# Patient Record
Sex: Female | Born: 1940
Health system: Southern US, Community
[De-identification: ages and names within clinical notes are randomized; demographics above are authoritative.]

## PROBLEM LIST (undated history)

## (undated) DIAGNOSIS — F329 Major depressive disorder, single episode, unspecified: Secondary | ICD-10-CM

## (undated) DIAGNOSIS — M940 Chondrocostal junction syndrome [Tietze]: Secondary | ICD-10-CM

## (undated) DIAGNOSIS — I2510 Atherosclerotic heart disease of native coronary artery without angina pectoris: Secondary | ICD-10-CM

## (undated) DIAGNOSIS — R519 Headache, unspecified: Secondary | ICD-10-CM

## (undated) DIAGNOSIS — K209 Esophagitis, unspecified without bleeding: Secondary | ICD-10-CM

## (undated) DIAGNOSIS — R51 Headache: Secondary | ICD-10-CM

## (undated) DIAGNOSIS — F410 Panic disorder [episodic paroxysmal anxiety] without agoraphobia: Secondary | ICD-10-CM

## (undated) DIAGNOSIS — I10 Essential (primary) hypertension: Secondary | ICD-10-CM

## (undated) DIAGNOSIS — F419 Anxiety disorder, unspecified: Secondary | ICD-10-CM

## (undated) DIAGNOSIS — E785 Hyperlipidemia, unspecified: Secondary | ICD-10-CM

## (undated) DIAGNOSIS — E119 Type 2 diabetes mellitus without complications: Secondary | ICD-10-CM

## (undated) DIAGNOSIS — K649 Unspecified hemorrhoids: Secondary | ICD-10-CM

## (undated) DIAGNOSIS — K219 Gastro-esophageal reflux disease without esophagitis: Secondary | ICD-10-CM

## (undated) DIAGNOSIS — F32A Depression, unspecified: Secondary | ICD-10-CM

## (undated) DIAGNOSIS — T7840XA Allergy, unspecified, initial encounter: Secondary | ICD-10-CM

## (undated) DIAGNOSIS — I509 Heart failure, unspecified: Secondary | ICD-10-CM

## (undated) HISTORY — DX: Heart failure, unspecified: I50.9

## (undated) HISTORY — PX: APPENDECTOMY: SHX54

## (undated) HISTORY — DX: Atherosclerotic heart disease of native coronary artery without angina pectoris: I25.10

## (undated) HISTORY — DX: Anxiety disorder, unspecified: F41.9

## (undated) HISTORY — PX: KNEE SURGERY: SHX244

## (undated) HISTORY — PX: DILATION AND CURETTAGE, DIAGNOSTIC / THERAPEUTIC: SUR384

---

## 2009-06-28 ENCOUNTER — Ambulatory Visit: Payer: Self-pay | Admitting: Unknown Physician Specialty

## 2015-09-23 DIAGNOSIS — E119 Type 2 diabetes mellitus without complications: Secondary | ICD-10-CM | POA: Diagnosis not present

## 2015-09-23 DIAGNOSIS — E785 Hyperlipidemia, unspecified: Secondary | ICD-10-CM | POA: Diagnosis not present

## 2015-09-23 DIAGNOSIS — K219 Gastro-esophageal reflux disease without esophagitis: Secondary | ICD-10-CM | POA: Diagnosis not present

## 2015-10-21 DIAGNOSIS — F4002 Agoraphobia without panic disorder: Secondary | ICD-10-CM | POA: Diagnosis not present

## 2015-11-16 DIAGNOSIS — H8112 Benign paroxysmal vertigo, left ear: Secondary | ICD-10-CM | POA: Diagnosis not present

## 2015-11-23 DIAGNOSIS — H8112 Benign paroxysmal vertigo, left ear: Secondary | ICD-10-CM | POA: Diagnosis not present

## 2015-11-30 DIAGNOSIS — H8111 Benign paroxysmal vertigo, right ear: Secondary | ICD-10-CM | POA: Diagnosis not present

## 2015-12-07 DIAGNOSIS — R42 Dizziness and giddiness: Secondary | ICD-10-CM | POA: Diagnosis not present

## 2015-12-29 DIAGNOSIS — Z85828 Personal history of other malignant neoplasm of skin: Secondary | ICD-10-CM | POA: Diagnosis not present

## 2015-12-29 DIAGNOSIS — D485 Neoplasm of uncertain behavior of skin: Secondary | ICD-10-CM | POA: Diagnosis not present

## 2015-12-29 DIAGNOSIS — L718 Other rosacea: Secondary | ICD-10-CM | POA: Diagnosis not present

## 2015-12-29 DIAGNOSIS — D0471 Carcinoma in situ of skin of right lower limb, including hip: Secondary | ICD-10-CM | POA: Diagnosis not present

## 2015-12-29 DIAGNOSIS — Z08 Encounter for follow-up examination after completed treatment for malignant neoplasm: Secondary | ICD-10-CM | POA: Diagnosis not present

## 2015-12-29 DIAGNOSIS — L565 Disseminated superficial actinic porokeratosis (DSAP): Secondary | ICD-10-CM | POA: Diagnosis not present

## 2016-02-08 DIAGNOSIS — F329 Major depressive disorder, single episode, unspecified: Secondary | ICD-10-CM | POA: Diagnosis not present

## 2016-02-08 DIAGNOSIS — E119 Type 2 diabetes mellitus without complications: Secondary | ICD-10-CM | POA: Diagnosis not present

## 2016-02-08 DIAGNOSIS — I10 Essential (primary) hypertension: Secondary | ICD-10-CM | POA: Diagnosis not present

## 2016-02-08 DIAGNOSIS — E785 Hyperlipidemia, unspecified: Secondary | ICD-10-CM | POA: Diagnosis not present

## 2016-02-08 DIAGNOSIS — F410 Panic disorder [episodic paroxysmal anxiety] without agoraphobia: Secondary | ICD-10-CM | POA: Diagnosis not present

## 2016-04-05 DIAGNOSIS — L578 Other skin changes due to chronic exposure to nonionizing radiation: Secondary | ICD-10-CM | POA: Diagnosis not present

## 2016-04-05 DIAGNOSIS — L908 Other atrophic disorders of skin: Secondary | ICD-10-CM | POA: Diagnosis not present

## 2016-04-05 DIAGNOSIS — D0471 Carcinoma in situ of skin of right lower limb, including hip: Secondary | ICD-10-CM | POA: Diagnosis not present

## 2016-04-05 DIAGNOSIS — D492 Neoplasm of unspecified behavior of bone, soft tissue, and skin: Secondary | ICD-10-CM | POA: Diagnosis not present

## 2016-04-05 DIAGNOSIS — L820 Inflamed seborrheic keratosis: Secondary | ICD-10-CM | POA: Diagnosis not present

## 2016-04-27 DIAGNOSIS — F4002 Agoraphobia without panic disorder: Secondary | ICD-10-CM | POA: Diagnosis not present

## 2016-06-07 DIAGNOSIS — L578 Other skin changes due to chronic exposure to nonionizing radiation: Secondary | ICD-10-CM | POA: Diagnosis not present

## 2016-06-07 DIAGNOSIS — Z85828 Personal history of other malignant neoplasm of skin: Secondary | ICD-10-CM | POA: Diagnosis not present

## 2016-08-02 DIAGNOSIS — I10 Essential (primary) hypertension: Secondary | ICD-10-CM | POA: Diagnosis not present

## 2016-08-02 DIAGNOSIS — Z1211 Encounter for screening for malignant neoplasm of colon: Secondary | ICD-10-CM | POA: Diagnosis not present

## 2016-08-02 DIAGNOSIS — E119 Type 2 diabetes mellitus without complications: Secondary | ICD-10-CM | POA: Diagnosis not present

## 2016-10-04 DIAGNOSIS — E119 Type 2 diabetes mellitus without complications: Secondary | ICD-10-CM | POA: Diagnosis not present

## 2016-10-04 DIAGNOSIS — K219 Gastro-esophageal reflux disease without esophagitis: Secondary | ICD-10-CM | POA: Diagnosis not present

## 2016-10-04 DIAGNOSIS — Z86010 Personal history of colonic polyps: Secondary | ICD-10-CM | POA: Diagnosis not present

## 2016-10-26 DIAGNOSIS — F4002 Agoraphobia without panic disorder: Secondary | ICD-10-CM | POA: Diagnosis not present

## 2016-11-01 DIAGNOSIS — H8112 Benign paroxysmal vertigo, left ear: Secondary | ICD-10-CM | POA: Diagnosis not present

## 2016-11-16 DIAGNOSIS — H8112 Benign paroxysmal vertigo, left ear: Secondary | ICD-10-CM | POA: Diagnosis not present

## 2016-11-27 DIAGNOSIS — H8112 Benign paroxysmal vertigo, left ear: Secondary | ICD-10-CM | POA: Diagnosis not present

## 2016-12-18 ENCOUNTER — Encounter: Payer: Self-pay | Admitting: *Deleted

## 2017-03-29 ENCOUNTER — Encounter
Admission: RE | Disposition: A | Discharge status: Home / Self Care | Payer: Self-pay | Source: Ambulatory Visit | Attending: Unknown Physician Specialty

## 2017-03-29 ENCOUNTER — Ambulatory Visit: Payer: PPO | Admitting: Anesthesiology

## 2017-03-29 ENCOUNTER — Ambulatory Visit
Admission: RE | Admit: 2017-03-29 | Discharge: 2017-03-29 | Disposition: A | Discharge status: Home / Self Care | Payer: PPO | Source: Ambulatory Visit | Attending: Unknown Physician Specialty | Admitting: Unknown Physician Specialty

## 2017-03-29 DIAGNOSIS — K2950 Unspecified chronic gastritis without bleeding: Secondary | ICD-10-CM | POA: Insufficient documentation

## 2017-03-29 DIAGNOSIS — Z888 Allergy status to other drugs, medicaments and biological substances status: Secondary | ICD-10-CM | POA: Diagnosis not present

## 2017-03-29 DIAGNOSIS — E119 Type 2 diabetes mellitus without complications: Secondary | ICD-10-CM | POA: Diagnosis not present

## 2017-03-29 DIAGNOSIS — Z79899 Other long term (current) drug therapy: Secondary | ICD-10-CM | POA: Diagnosis not present

## 2017-03-29 DIAGNOSIS — F419 Anxiety disorder, unspecified: Secondary | ICD-10-CM | POA: Diagnosis not present

## 2017-03-29 DIAGNOSIS — Z886 Allergy status to analgesic agent status: Secondary | ICD-10-CM | POA: Diagnosis not present

## 2017-03-29 DIAGNOSIS — F329 Major depressive disorder, single episode, unspecified: Secondary | ICD-10-CM | POA: Insufficient documentation

## 2017-03-29 DIAGNOSIS — F410 Panic disorder [episodic paroxysmal anxiety] without agoraphobia: Secondary | ICD-10-CM | POA: Insufficient documentation

## 2017-03-29 DIAGNOSIS — Z800 Family history of malignant neoplasm of digestive organs: Secondary | ICD-10-CM | POA: Insufficient documentation

## 2017-03-29 DIAGNOSIS — Z880 Allergy status to penicillin: Secondary | ICD-10-CM | POA: Diagnosis not present

## 2017-03-29 DIAGNOSIS — Z86010 Personal history of colonic polyps: Secondary | ICD-10-CM | POA: Diagnosis not present

## 2017-03-29 DIAGNOSIS — K210 Gastro-esophageal reflux disease with esophagitis: Secondary | ICD-10-CM | POA: Diagnosis not present

## 2017-03-29 DIAGNOSIS — K2960 Other gastritis without bleeding: Secondary | ICD-10-CM | POA: Diagnosis not present

## 2017-03-29 DIAGNOSIS — K6389 Other specified diseases of intestine: Secondary | ICD-10-CM | POA: Diagnosis not present

## 2017-03-29 DIAGNOSIS — Z885 Allergy status to narcotic agent status: Secondary | ICD-10-CM | POA: Insufficient documentation

## 2017-03-29 DIAGNOSIS — Z882 Allergy status to sulfonamides status: Secondary | ICD-10-CM | POA: Insufficient documentation

## 2017-03-29 DIAGNOSIS — K635 Polyp of colon: Secondary | ICD-10-CM | POA: Diagnosis not present

## 2017-03-29 DIAGNOSIS — K2980 Duodenitis without bleeding: Secondary | ICD-10-CM | POA: Diagnosis not present

## 2017-03-29 DIAGNOSIS — K640 First degree hemorrhoids: Secondary | ICD-10-CM | POA: Diagnosis not present

## 2017-03-29 DIAGNOSIS — K3189 Other diseases of stomach and duodenum: Secondary | ICD-10-CM | POA: Diagnosis not present

## 2017-03-29 DIAGNOSIS — I10 Essential (primary) hypertension: Secondary | ICD-10-CM | POA: Insufficient documentation

## 2017-03-29 DIAGNOSIS — E785 Hyperlipidemia, unspecified: Secondary | ICD-10-CM | POA: Insufficient documentation

## 2017-03-29 DIAGNOSIS — D128 Benign neoplasm of rectum: Secondary | ICD-10-CM | POA: Diagnosis not present

## 2017-03-29 DIAGNOSIS — K317 Polyp of stomach and duodenum: Secondary | ICD-10-CM | POA: Insufficient documentation

## 2017-03-29 DIAGNOSIS — Z1211 Encounter for screening for malignant neoplasm of colon: Secondary | ICD-10-CM | POA: Diagnosis not present

## 2017-03-29 HISTORY — DX: Major depressive disorder, single episode, unspecified: F32.9

## 2017-03-29 HISTORY — PX: COLONOSCOPY: SHX5424

## 2017-03-29 HISTORY — DX: Unspecified hemorrhoids: K64.9

## 2017-03-29 HISTORY — DX: Type 2 diabetes mellitus without complications: E11.9

## 2017-03-29 HISTORY — DX: Allergy, unspecified, initial encounter: T78.40XA

## 2017-03-29 HISTORY — DX: Esophagitis, unspecified without bleeding: K20.90

## 2017-03-29 HISTORY — DX: Essential (primary) hypertension: I10

## 2017-03-29 HISTORY — DX: Chondrocostal junction syndrome (tietze): M94.0

## 2017-03-29 HISTORY — DX: Headache: R51

## 2017-03-29 HISTORY — DX: Esophagitis, unspecified: K20.9

## 2017-03-29 HISTORY — DX: Gastro-esophageal reflux disease without esophagitis: K21.9

## 2017-03-29 HISTORY — DX: Depression, unspecified: F32.A

## 2017-03-29 HISTORY — PX: ESOPHAGOGASTRODUODENOSCOPY (EGD) WITH PROPOFOL: SHX5813

## 2017-03-29 HISTORY — DX: Panic disorder (episodic paroxysmal anxiety): F41.0

## 2017-03-29 HISTORY — DX: Hyperlipidemia, unspecified: E78.5

## 2017-03-29 HISTORY — DX: Headache, unspecified: R51.9

## 2017-03-29 SURGERY — COLONOSCOPY
Anesthesia: General

## 2017-03-29 MED ORDER — EPHEDRINE SULFATE 50 MG/ML IJ SOLN
INTRAMUSCULAR | Status: DC | PRN
  Administered 2017-03-29 (×2): 10 mg via INTRAVENOUS

## 2017-03-29 MED ORDER — PROPOFOL 500 MG/50ML IV EMUL
INTRAVENOUS | Status: AC
  Filled 2017-03-29: qty 50

## 2017-03-29 MED ORDER — PROPOFOL 10 MG/ML IV BOLUS
INTRAVENOUS | Status: DC | PRN
  Administered 2017-03-29: 30 mg via INTRAVENOUS
  Administered 2017-03-29: 20 mg via INTRAVENOUS

## 2017-03-29 MED ORDER — SODIUM CHLORIDE 0.9 % IV SOLN
INTRAVENOUS | Status: DC
  Administered 2017-03-29: 1000 mL via INTRAVENOUS

## 2017-03-29 MED ORDER — PROPOFOL 500 MG/50ML IV EMUL
INTRAVENOUS | Status: DC | PRN
  Administered 2017-03-29: 120 ug/kg/min via INTRAVENOUS

## 2017-03-29 MED ORDER — LIDOCAINE HCL (CARDIAC) 20 MG/ML IV SOLN
INTRAVENOUS | Status: DC | PRN
  Administered 2017-03-29: 2 mL via INTRAVENOUS

## 2017-03-29 MED ORDER — EPHEDRINE SULFATE 50 MG/ML IJ SOLN
INTRAMUSCULAR | Status: AC
  Filled 2017-03-29: qty 1

## 2017-03-29 MED ORDER — LIDOCAINE HCL (PF) 2 % IJ SOLN
INTRAMUSCULAR | Status: AC
  Filled 2017-03-29: qty 2

## 2017-03-29 MED ORDER — SODIUM CHLORIDE 0.9 % IV SOLN: INTRAVENOUS | Status: DC

## 2017-03-29 NOTE — Anesthesia Postprocedure Evaluation (Signed)
Anesthesia Post Note  Patient: Emily Jones  Procedure(s) Performed: Procedure(s) (LRB): COLONOSCOPY (N/A) ESOPHAGOGASTRODUODENOSCOPY (EGD) WITH PROPOFOL (N/A)  Patient location during evaluation: PACU Anesthesia Type: General Level of consciousness: awake Pain management: pain level controlled Vital Signs Assessment: post-procedure vital signs reviewed and stable Respiratory status: nonlabored ventilation Cardiovascular status: stable Anesthetic complications: no     Last Vitals:  Vitals:   03/29/17 0910 03/29/17 0920  BP: 135/69 134/78  Pulse: 78 77  Resp: 16 (!) 23  Temp:      Last Pain:  Vitals:   03/29/17 0850  TempSrc: Tympanic                 VAN STAVEREN,[name]

## 2017-03-29 NOTE — Op Note (Signed)
[facility] Gastroenterology Patient Name: [name] Procedure Date: 03/29/2017 7:59 AM MRN: [id number] Account #: [id number] Date of Birth: 1940-11-29 Admit Type: Outpatient Age: 76 Room: [facility] ENDO ROOM 3 Gender: Female Note Status: Finalized Procedure:            Upper GI endoscopy Indications:          Heartburn Providers:            Manya Silvas, MD Referring MD:         Irven Easterly. Kary Kos, MD (Referring MD) Medicines:            Propofol per Anesthesia Complications:        No immediate complications. Procedure:            Pre-Anesthesia Assessment:                       - After reviewing the risks and benefits, the patient                        was deemed in satisfactory condition to undergo the                        procedure.                       After obtaining informed consent, the endoscope was                        passed under direct vision. Throughout the procedure,                        the patient's blood pressure, pulse, and oxygen                        saturations were monitored continuously. The Endoscope                        was introduced through the mouth, and advanced to the                        second part of duodenum. The upper GI endoscopy was                        somewhat difficult due to Sharp turn in antral duodenal                        area. The patient tolerated the procedure well. Findings:      LA Grade A (one or more mucosal breaks less than 5 mm, not extending       between tops of 2 mucosal folds) esophagitis with no bleeding was found       39 cm from the incisors. Biopsies were taken with a cold forceps for       histology.      Multiple small sessile polyps with no bleeding and no stigmata of recent       bleeding were found in the gastric body. Bx done of stomach to check for       Helicobacter.      Diffuse atrophic mucosa was found in the second portion of the duodenum       as there were  very small  duodenal folds in the second portion. Biopsies       were taken with a cold forceps for histology. Impression:           - LA Grade A reflux esophagitis. Biopsied.                       - Multiple gastric polyps.                       - Duodenal mucosal atrophy. Biopsied. Recommendation:       - Await pathology results. Manya Silvas, MD 03/29/2017 8:30:37 AM This report has been signed electronically. Number of Addenda: 0 Note Initiated On: 03/29/2017 7:59 AM      [facility]

## 2017-03-29 NOTE — Anesthesia Post-op Follow-up Note (Signed)
Anesthesia QCDR form completed.        

## 2017-03-29 NOTE — Anesthesia Preprocedure Evaluation (Signed)
Anesthesia Evaluation  Patient identified by MRN, date of birth, ID band Patient awake    Reviewed: Allergy & Precautions, NPO status , Patient's Chart, lab work & pertinent test results  Airway Mallampati: II       Dental  (+) Teeth Intact, Upper Dentures   Pulmonary neg pulmonary ROS,    breath sounds clear to auscultation       Cardiovascular Exercise Tolerance: Good hypertension,  Rhythm:Regular     Neuro/Psych  Headaches, Anxiety Depression    GI/Hepatic Neg liver ROS, GERD  ,  Endo/Other  diabetes, Well Controlled, Type 2  Renal/GU negative Renal ROS     Musculoskeletal negative musculoskeletal ROS (+)   Abdominal Normal abdominal exam  (+)   Peds negative pediatric ROS (+)  Hematology negative hematology ROS (+)   Anesthesia Other Findings   Reproductive/Obstetrics                             Anesthesia Physical Anesthesia Plan  ASA: II  Anesthesia Plan: General   Post-op Pain Management:    Induction: Intravenous  PONV Risk Score and Plan: 0  Airway Management Planned: Natural Airway and Nasal Cannula  Additional Equipment:   Intra-op Plan:   Post-operative Plan:   Informed Consent: I have reviewed the patients History and Physical, chart, labs and discussed the procedure including the risks, benefits and alternatives for the proposed anesthesia with the patient or authorized representative who has indicated his/her understanding and acceptance.     Plan Discussed with: Surgeon  Anesthesia Plan Comments:         Anesthesia Quick Evaluation

## 2017-03-29 NOTE — H&P (Signed)
Primary Care Physician:  [place] Pink, MD Primary Gastroenterologist:  Dr. Vira Agar  Pre-Procedure History & Physical: HPI:  Emily Jones is a 76 y.o. female is here for an endoscopy and colonoscopy.   Past Medical History:  Diagnosis Date  . Allergic state   . Costochondritis   . Depression   . Diabetes mellitus without complication ([place])   . Esophagitis   . GERD (gastroesophageal reflux disease)   . Headache   . Hemorrhoids   . Hyperlipidemia   . Hypertension   . Panic attacks     Past Surgical History:  Procedure Laterality Date  . APPENDECTOMY    . DILATION AND CURETTAGE, DIAGNOSTIC / THERAPEUTIC    . KNEE SURGERY      Prior to Admission medications   Medication Sig Start Date End Date Taking? Authorizing Provider  acetaminophen (TYLENOL) 325 MG tablet Take by mouth every 6 (six) hours as needed.   Yes [provider]  clonazePAM (KLONOPIN) 0.5 MG tablet Take 0.25 mg by mouth daily.   Yes [provider]  mupirocin ointment (BACTROBAN) 2 % Place 1 application into the nose 2 (two) times daily.   Yes [provider]  omega-3 acid ethyl esters (LOVAZA) 1 g capsule Take 1 g by mouth daily.   Yes [provider]  omeprazole (PRILOSEC) 20 MG capsule Take 20 mg by mouth daily.   Yes [provider]  sertraline (ZOLOFT) 100 MG tablet Take 100 mg by mouth daily.   Yes [provider]  VITAMIN C, CALCIUM ASCORBATE, PO Take 1 tablet by mouth daily.   Yes [provider]  meclizine (ANTIVERT) 25 MG tablet Take 25 mg by mouth 3 (three) times daily as needed for dizziness.    [provider]    Allergies as of 11/06/2016  . (Not on File)    No family history on file.  Social History   Social History  . Marital status: Married    Spouse name: N/A  . Number of children: N/A  . Years of education: N/A   Occupational History  . Not on file.   Social History Main Topics  . Smoking status: Never  Smoker  . Smokeless tobacco: Never Used  . Alcohol use No  . Drug use: No  . Sexual activity: Not on file   Other Topics Concern  . Not on file   Social History Narrative  . No narrative on file    Review of Systems: See HPI, otherwise negative ROS  Physical Exam: BP (!) 162/86   Pulse 98   Temp 97.8 F (36.6 C) (Tympanic)   Resp 18   Ht 5\' 3"  (1.6 m)   Wt 69.4 kg (153 lb)   SpO2 96%   BMI 27.10 kg/m  General:   Alert,  pleasant and cooperative in NAD Head:  Normocephalic and atraumatic. Neck:  Supple; no masses or thyromegaly. Lungs:  Clear throughout to auscultation.    Heart:  Regular rate and rhythm. Abdomen:  Soft, nontender and nondistended. Normal bowel sounds, without guarding, and without rebound.   Neurologic:  Alert and  oriented x4;  grossly normal neurologically.  Impression/Plan: Emily Jones is here for an endoscopy and colonoscopy to be performed for heartburn and colon cancer screening due to colon cancer in her father.  Risks, benefits, limitations, and alternatives regarding  endoscopy and colonoscopy have been reviewed with the patient.  Questions have been answered.  All parties agreeable.   Treana Lacour,  Presleigh Feldstein, MD  03/29/2017, 8:00 AM

## 2017-03-29 NOTE — Transfer of Care (Signed)
Immediate Anesthesia Transfer of Care Note  Patient: Emily Jones  Procedure(s) Performed: Procedure(s): COLONOSCOPY (N/A) ESOPHAGOGASTRODUODENOSCOPY (EGD) WITH PROPOFOL (N/A)  Patient Location: PACU  Anesthesia Type:General  Level of Consciousness: awake  Airway & Oxygen Therapy: Patient Spontanous Breathing and Patient connected to nasal cannula oxygen  Post-op Assessment: Report given to RN  Post vital signs: Reviewed  Last Vitals:  Vitals:   03/29/17 0737  BP: (!) 162/86  Pulse: 98  Resp: 18  Temp: 36.6 C    Last Pain:  Vitals:   03/29/17 0737  TempSrc: Tympanic         Complications: No apparent anesthesia complications

## 2017-03-29 NOTE — Op Note (Signed)
[facility] Gastroenterology Patient Name: Emily Jones Procedure Date: 03/29/2017 7:59 AM MRN: [id number] Account #: [id number] Date of Birth: [birth year] Admit Type: Outpatient Age: 76 Room: [facility] ENDO ROOM 3 Gender: Female Note Status: Finalized Procedure:            Colonoscopy Indications:          Screening in patient at increased risk: Family history                        of 1st-degree relative with colorectal cancer Providers:            Manya Silvas, MD Referring MD:         Irven Easterly. Kary Kos, MD (Referring MD) Medicines:            Propofol per Anesthesia Complications:        No immediate complications. Procedure:            Pre-Anesthesia Assessment:                       - After reviewing the risks and benefits, the patient                        was deemed in satisfactory condition to undergo the                        procedure.                       After obtaining informed consent, the colonoscope was                        passed under direct vision. Throughout the procedure,                        the patient's blood pressure, pulse, and oxygen                        saturations were monitored continuously. The                        Colonoscope was introduced through the anus and                        advanced to the the cecum, identified by appendiceal                        orifice and ileocecal valve. The colonoscopy was                        performed without difficulty. The patient tolerated the                        procedure well. The quality of the bowel preparation                        was excellent. Findings:      A small polyp was found in the rectum. The polyp was sessile. The polyp       was removed with a hot snare. Resection and retrieval were complete.      Internal hemorrhoids were found during endoscopy. The hemorrhoids  were       small and Grade I (internal hemorrhoids that do not prolapse).      The exam was  otherwise without abnormality. Impression:           - One small polyp in the rectum, removed with a hot                        snare. Resected and retrieved.                       - Internal hemorrhoids.                       - The examination was otherwise normal. Recommendation:       - Await pathology results. Manya Silvas, MD 03/29/2017 8:50:37 AM This report has been signed electronically. Number of Addenda: 0 Note Initiated On: 03/29/2017 7:59 AM Scope Withdrawal Time: 0 hours 11 minutes 28 seconds  Total Procedure Duration: 0 hours 15 minutes 37 seconds       [facility]

## 2017-04-01 ENCOUNTER — Encounter: Payer: Self-pay | Admitting: Unknown Physician Specialty

## 2017-04-01 LAB — SURGICAL PATHOLOGY

## 2017-04-26 DIAGNOSIS — F4002 Agoraphobia without panic disorder: Secondary | ICD-10-CM | POA: Diagnosis not present

## 2017-09-02 DIAGNOSIS — M25572 Pain in left ankle and joints of left foot: Secondary | ICD-10-CM | POA: Diagnosis not present

## 2017-09-02 DIAGNOSIS — R201 Hypoesthesia of skin: Secondary | ICD-10-CM | POA: Diagnosis not present

## 2017-09-04 DIAGNOSIS — M25572 Pain in left ankle and joints of left foot: Secondary | ICD-10-CM | POA: Diagnosis not present

## 2017-09-04 DIAGNOSIS — R201 Hypoesthesia of skin: Secondary | ICD-10-CM | POA: Diagnosis not present

## 2017-09-06 DIAGNOSIS — M25572 Pain in left ankle and joints of left foot: Secondary | ICD-10-CM | POA: Diagnosis not present

## 2017-09-06 DIAGNOSIS — R201 Hypoesthesia of skin: Secondary | ICD-10-CM | POA: Diagnosis not present

## 2017-09-09 DIAGNOSIS — R201 Hypoesthesia of skin: Secondary | ICD-10-CM | POA: Diagnosis not present

## 2017-09-09 DIAGNOSIS — M25572 Pain in left ankle and joints of left foot: Secondary | ICD-10-CM | POA: Diagnosis not present

## 2017-09-11 DIAGNOSIS — M25572 Pain in left ankle and joints of left foot: Secondary | ICD-10-CM | POA: Diagnosis not present

## 2017-09-11 DIAGNOSIS — R201 Hypoesthesia of skin: Secondary | ICD-10-CM | POA: Diagnosis not present

## 2017-09-16 DIAGNOSIS — M25572 Pain in left ankle and joints of left foot: Secondary | ICD-10-CM | POA: Diagnosis not present

## 2017-09-16 DIAGNOSIS — R201 Hypoesthesia of skin: Secondary | ICD-10-CM | POA: Diagnosis not present

## 2017-10-02 DIAGNOSIS — M25572 Pain in left ankle and joints of left foot: Secondary | ICD-10-CM | POA: Diagnosis not present

## 2017-10-02 DIAGNOSIS — R201 Hypoesthesia of skin: Secondary | ICD-10-CM | POA: Diagnosis not present

## 2017-10-16 DIAGNOSIS — R201 Hypoesthesia of skin: Secondary | ICD-10-CM | POA: Diagnosis not present

## 2017-10-16 DIAGNOSIS — M25572 Pain in left ankle and joints of left foot: Secondary | ICD-10-CM | POA: Diagnosis not present

## 2017-11-15 DIAGNOSIS — M25572 Pain in left ankle and joints of left foot: Secondary | ICD-10-CM | POA: Diagnosis not present

## 2017-11-15 DIAGNOSIS — R201 Hypoesthesia of skin: Secondary | ICD-10-CM | POA: Diagnosis not present

## 2017-11-20 DIAGNOSIS — M25572 Pain in left ankle and joints of left foot: Secondary | ICD-10-CM | POA: Diagnosis not present

## 2017-11-20 DIAGNOSIS — R201 Hypoesthesia of skin: Secondary | ICD-10-CM | POA: Diagnosis not present

## 2017-11-28 DIAGNOSIS — I10 Essential (primary) hypertension: Secondary | ICD-10-CM | POA: Diagnosis not present

## 2017-11-28 DIAGNOSIS — Z23 Encounter for immunization: Secondary | ICD-10-CM | POA: Diagnosis not present

## 2017-11-28 DIAGNOSIS — E1165 Type 2 diabetes mellitus with hyperglycemia: Secondary | ICD-10-CM | POA: Diagnosis not present

## 2017-11-28 DIAGNOSIS — F410 Panic disorder [episodic paroxysmal anxiety] without agoraphobia: Secondary | ICD-10-CM | POA: Diagnosis not present

## 2017-11-28 DIAGNOSIS — E785 Hyperlipidemia, unspecified: Secondary | ICD-10-CM | POA: Diagnosis not present

## 2017-12-06 DIAGNOSIS — E1165 Type 2 diabetes mellitus with hyperglycemia: Secondary | ICD-10-CM | POA: Diagnosis not present

## 2017-12-06 DIAGNOSIS — E785 Hyperlipidemia, unspecified: Secondary | ICD-10-CM | POA: Diagnosis not present

## 2017-12-06 DIAGNOSIS — I10 Essential (primary) hypertension: Secondary | ICD-10-CM | POA: Diagnosis not present

## 2018-04-25 DIAGNOSIS — F4002 Agoraphobia without panic disorder: Secondary | ICD-10-CM | POA: Diagnosis not present

## 2018-06-05 DIAGNOSIS — Z0000 Encounter for general adult medical examination without abnormal findings: Secondary | ICD-10-CM | POA: Diagnosis not present

## 2018-06-05 DIAGNOSIS — E785 Hyperlipidemia, unspecified: Secondary | ICD-10-CM | POA: Diagnosis not present

## 2018-06-05 DIAGNOSIS — R21 Rash and other nonspecific skin eruption: Secondary | ICD-10-CM | POA: Diagnosis not present

## 2018-06-05 DIAGNOSIS — F410 Panic disorder [episodic paroxysmal anxiety] without agoraphobia: Secondary | ICD-10-CM | POA: Diagnosis not present

## 2018-06-05 DIAGNOSIS — I10 Essential (primary) hypertension: Secondary | ICD-10-CM | POA: Diagnosis not present

## 2018-06-05 DIAGNOSIS — E1165 Type 2 diabetes mellitus with hyperglycemia: Secondary | ICD-10-CM | POA: Diagnosis not present

## 2018-06-05 DIAGNOSIS — Z23 Encounter for immunization: Secondary | ICD-10-CM | POA: Diagnosis not present

## 2018-06-18 ENCOUNTER — Other Ambulatory Visit: Payer: Self-pay | Admitting: Family Medicine

## 2018-10-31 DIAGNOSIS — F4001 Agoraphobia with panic disorder: Secondary | ICD-10-CM | POA: Diagnosis not present

## 2019-05-08 DIAGNOSIS — F4001 Agoraphobia with panic disorder: Secondary | ICD-10-CM | POA: Diagnosis not present

## 2020-04-15 DIAGNOSIS — F4001 Agoraphobia with panic disorder: Secondary | ICD-10-CM | POA: Diagnosis not present

## 2020-05-19 ENCOUNTER — Other Ambulatory Visit: Payer: Self-pay

## 2020-05-19 ENCOUNTER — Emergency Department: Payer: PPO

## 2020-05-19 DIAGNOSIS — Z880 Allergy status to penicillin: Secondary | ICD-10-CM | POA: Diagnosis not present

## 2020-05-19 DIAGNOSIS — I213 ST elevation (STEMI) myocardial infarction of unspecified site: Secondary | ICD-10-CM | POA: Diagnosis not present

## 2020-05-19 DIAGNOSIS — Z888 Allergy status to other drugs, medicaments and biological substances status: Secondary | ICD-10-CM | POA: Diagnosis not present

## 2020-05-19 DIAGNOSIS — I10 Essential (primary) hypertension: Secondary | ICD-10-CM | POA: Diagnosis present

## 2020-05-19 DIAGNOSIS — I2510 Atherosclerotic heart disease of native coronary artery without angina pectoris: Secondary | ICD-10-CM | POA: Diagnosis present

## 2020-05-19 DIAGNOSIS — Z885 Allergy status to narcotic agent status: Secondary | ICD-10-CM | POA: Diagnosis not present

## 2020-05-19 DIAGNOSIS — E7800 Pure hypercholesterolemia, unspecified: Secondary | ICD-10-CM | POA: Diagnosis present

## 2020-05-19 DIAGNOSIS — F329 Major depressive disorder, single episode, unspecified: Secondary | ICD-10-CM | POA: Diagnosis present

## 2020-05-19 DIAGNOSIS — I2119 ST elevation (STEMI) myocardial infarction involving other coronary artery of inferior wall: Secondary | ICD-10-CM | POA: Diagnosis present

## 2020-05-19 DIAGNOSIS — K219 Gastro-esophageal reflux disease without esophagitis: Secondary | ICD-10-CM | POA: Diagnosis not present

## 2020-05-19 DIAGNOSIS — Z20822 Contact with and (suspected) exposure to covid-19: Secondary | ICD-10-CM | POA: Diagnosis present

## 2020-05-19 DIAGNOSIS — Z882 Allergy status to sulfonamides status: Secondary | ICD-10-CM

## 2020-05-19 DIAGNOSIS — I2121 ST elevation (STEMI) myocardial infarction involving left circumflex coronary artery: Secondary | ICD-10-CM | POA: Diagnosis not present

## 2020-05-19 DIAGNOSIS — I214 Non-ST elevation (NSTEMI) myocardial infarction: Secondary | ICD-10-CM | POA: Diagnosis not present

## 2020-05-19 DIAGNOSIS — R079 Chest pain, unspecified: Secondary | ICD-10-CM | POA: Diagnosis present

## 2020-05-19 DIAGNOSIS — Z79899 Other long term (current) drug therapy: Secondary | ICD-10-CM

## 2020-05-19 DIAGNOSIS — F419 Anxiety disorder, unspecified: Secondary | ICD-10-CM | POA: Diagnosis present

## 2020-05-19 DIAGNOSIS — E785 Hyperlipidemia, unspecified: Secondary | ICD-10-CM | POA: Diagnosis present

## 2020-05-19 DIAGNOSIS — E119 Type 2 diabetes mellitus without complications: Secondary | ICD-10-CM | POA: Diagnosis not present

## 2020-05-19 DIAGNOSIS — R0789 Other chest pain: Secondary | ICD-10-CM | POA: Diagnosis not present

## 2020-05-19 DIAGNOSIS — Z886 Allergy status to analgesic agent status: Secondary | ICD-10-CM

## 2020-05-19 LAB — CBC
HCT: 37.9 % (ref 36.0–46.0)
Hemoglobin: 12.9 g/dL (ref 12.0–15.0)
MCH: 27.2 pg (ref 26.0–34.0)
MCHC: 34 g/dL (ref 30.0–36.0)
MCV: 80 fL (ref 80.0–100.0)
Platelets: 181 10*3/uL (ref 150–400)
RBC: 4.74 MIL/uL (ref 3.87–5.11)
RDW: 15.1 % (ref 11.5–15.5)
WBC: 6.2 10*3/uL (ref 4.0–10.5)
nRBC: 0 % (ref 0.0–0.2)

## 2020-05-19 LAB — COMPREHENSIVE METABOLIC PANEL
ALT: 17 U/L (ref 0–44)
AST: 19 U/L (ref 15–41)
Albumin: 4.5 g/dL (ref 3.5–5.0)
Alkaline Phosphatase: 119 U/L (ref 38–126)
Anion gap: 14 (ref 5–15)
BUN: 19 mg/dL (ref 8–23)
CO2: 23 mmol/L (ref 22–32)
Calcium: 9.3 mg/dL (ref 8.9–10.3)
Chloride: 97 mmol/L — ABNORMAL LOW (ref 98–111)
Creatinine, Ser: 0.97 mg/dL (ref 0.44–1.00)
GFR calc Af Amer: >60 mL/min (ref >60)
GFR calc non Af Amer: 56 mL/min — ABNORMAL LOW (ref >60)
Glucose, Bld: 327 mg/dL — ABNORMAL HIGH (ref 70–99)
Potassium: 5 mmol/L (ref 3.5–5.1)
Sodium: 134 mmol/L — ABNORMAL LOW (ref 135–145)
Total Bilirubin: 1.1 mg/dL (ref 0.3–1.2)
Total Protein: 7.8 g/dL (ref 6.5–8.1)

## 2020-05-19 LAB — TROPONIN I (HIGH SENSITIVITY): Troponin I (High Sensitivity): 60 ng/L — ABNORMAL HIGH (ref <18)

## 2020-05-19 NOTE — ED Triage Notes (Signed)
Pt in with co mid sternal chest pain states radiates down both arms since 1630. No hx of heart disease, denies any recent illness.

## 2020-05-20 ENCOUNTER — Inpatient Hospital Stay
Admit: 2020-05-20 | Discharge: 2020-05-20 | Disposition: A | Discharge status: Home / Self Care | Payer: PPO | Attending: Cardiology | Admitting: Cardiology

## 2020-05-20 ENCOUNTER — Encounter
Admission: EM | Disposition: A | Discharge status: Home Health | Payer: Self-pay | Source: Home / Self Care | Attending: Internal Medicine

## 2020-05-20 ENCOUNTER — Other Ambulatory Visit: Payer: Self-pay

## 2020-05-20 ENCOUNTER — Encounter: Payer: Self-pay | Admitting: Cardiology

## 2020-05-20 ENCOUNTER — Inpatient Hospital Stay
Admission: EM | Admit: 2020-05-20 | Discharge: 2020-05-22 | DRG: 247 | Disposition: A | Discharge status: Home Health | Payer: PPO | Attending: Internal Medicine | Admitting: Internal Medicine

## 2020-05-20 DIAGNOSIS — I214 Non-ST elevation (NSTEMI) myocardial infarction: Secondary | ICD-10-CM | POA: Diagnosis not present

## 2020-05-20 DIAGNOSIS — Z20822 Contact with and (suspected) exposure to covid-19: Secondary | ICD-10-CM | POA: Diagnosis present

## 2020-05-20 DIAGNOSIS — Z888 Allergy status to other drugs, medicaments and biological substances status: Secondary | ICD-10-CM | POA: Diagnosis not present

## 2020-05-20 DIAGNOSIS — I2119 ST elevation (STEMI) myocardial infarction involving other coronary artery of inferior wall: Secondary | ICD-10-CM | POA: Diagnosis present

## 2020-05-20 DIAGNOSIS — I10 Essential (primary) hypertension: Secondary | ICD-10-CM | POA: Diagnosis present

## 2020-05-20 DIAGNOSIS — Z882 Allergy status to sulfonamides status: Secondary | ICD-10-CM | POA: Diagnosis not present

## 2020-05-20 DIAGNOSIS — F329 Major depressive disorder, single episode, unspecified: Secondary | ICD-10-CM

## 2020-05-20 DIAGNOSIS — Z880 Allergy status to penicillin: Secondary | ICD-10-CM | POA: Diagnosis not present

## 2020-05-20 DIAGNOSIS — I213 ST elevation (STEMI) myocardial infarction of unspecified site: Secondary | ICD-10-CM | POA: Diagnosis not present

## 2020-05-20 DIAGNOSIS — F419 Anxiety disorder, unspecified: Secondary | ICD-10-CM | POA: Diagnosis present

## 2020-05-20 DIAGNOSIS — K219 Gastro-esophageal reflux disease without esophagitis: Secondary | ICD-10-CM

## 2020-05-20 DIAGNOSIS — E119 Type 2 diabetes mellitus without complications: Secondary | ICD-10-CM

## 2020-05-20 DIAGNOSIS — R079 Chest pain, unspecified: Secondary | ICD-10-CM | POA: Diagnosis present

## 2020-05-20 DIAGNOSIS — I2121 ST elevation (STEMI) myocardial infarction involving left circumflex coronary artery: Secondary | ICD-10-CM

## 2020-05-20 DIAGNOSIS — Z885 Allergy status to narcotic agent status: Secondary | ICD-10-CM | POA: Diagnosis not present

## 2020-05-20 DIAGNOSIS — Z79899 Other long term (current) drug therapy: Secondary | ICD-10-CM | POA: Diagnosis not present

## 2020-05-20 DIAGNOSIS — E7800 Pure hypercholesterolemia, unspecified: Secondary | ICD-10-CM | POA: Diagnosis present

## 2020-05-20 DIAGNOSIS — E785 Hyperlipidemia, unspecified: Secondary | ICD-10-CM | POA: Diagnosis present

## 2020-05-20 DIAGNOSIS — Z886 Allergy status to analgesic agent status: Secondary | ICD-10-CM | POA: Diagnosis not present

## 2020-05-20 DIAGNOSIS — I2510 Atherosclerotic heart disease of native coronary artery without angina pectoris: Secondary | ICD-10-CM | POA: Diagnosis present

## 2020-05-20 HISTORY — PX: LEFT HEART CATH AND CORONARY ANGIOGRAPHY: CATH118249

## 2020-05-20 HISTORY — PX: CORONARY/GRAFT ACUTE MI REVASCULARIZATION: CATH118305

## 2020-05-20 LAB — CBC
HCT: 34.6 % — ABNORMAL LOW (ref 36.0–46.0)
Hemoglobin: 12 g/dL (ref 12.0–15.0)
MCH: 27.4 pg (ref 26.0–34.0)
MCHC: 34.7 g/dL (ref 30.0–36.0)
MCV: 79 fL — ABNORMAL LOW (ref 80.0–100.0)
Platelets: 173 10*3/uL (ref 150–400)
RBC: 4.38 MIL/uL (ref 3.87–5.11)
RDW: 15.3 % (ref 11.5–15.5)
WBC: 7.5 10*3/uL (ref 4.0–10.5)
nRBC: 0 % (ref 0.0–0.2)

## 2020-05-20 LAB — GLUCOSE, CAPILLARY
Glucose-Capillary: 127 mg/dL — ABNORMAL HIGH (ref 70–99)
Glucose-Capillary: 184 mg/dL — ABNORMAL HIGH (ref 70–99)
Glucose-Capillary: 197 mg/dL — ABNORMAL HIGH (ref 70–99)
Glucose-Capillary: 163 mg/dL — ABNORMAL HIGH (ref 70–99)
Glucose-Capillary: 169 mg/dL — ABNORMAL HIGH (ref 70–99)

## 2020-05-20 LAB — BASIC METABOLIC PANEL
Anion gap: 11 (ref 5–15)
BUN: 17 mg/dL (ref 8–23)
CO2: 23 mmol/L (ref 22–32)
Calcium: 8.8 mg/dL — ABNORMAL LOW (ref 8.9–10.3)
Chloride: 96 mmol/L — ABNORMAL LOW (ref 98–111)
Creatinine, Ser: 0.82 mg/dL (ref 0.44–1.00)
GFR calc Af Amer: >60 mL/min (ref >60)
GFR calc non Af Amer: >60 mL/min (ref >60)
Glucose, Bld: 202 mg/dL — ABNORMAL HIGH (ref 70–99)
Potassium: 4.5 mmol/L (ref 3.5–5.1)
Sodium: 130 mmol/L — ABNORMAL LOW (ref 135–145)

## 2020-05-20 LAB — HEMOGLOBIN A1C
Hgb A1c MFr Bld: 8.3 % — ABNORMAL HIGH (ref 4.8–5.6)
Mean Plasma Glucose: 191.51 mg/dL

## 2020-05-20 LAB — RESPIRATORY PANEL BY RT PCR (FLU A&B, COVID)
Influenza A by PCR: NEGATIVE
Influenza B by PCR: NEGATIVE
SARS Coronavirus 2 by RT PCR: NEGATIVE

## 2020-05-20 LAB — TROPONIN I (HIGH SENSITIVITY): Troponin I (High Sensitivity): 185 ng/L (ref <18)

## 2020-05-20 LAB — POCT ACTIVATED CLOTTING TIME
Activated Clotting Time: 285 seconds
Activated Clotting Time: 290 seconds
Activated Clotting Time: 345 seconds

## 2020-05-20 LAB — MRSA PCR SCREENING: MRSA by PCR: NEGATIVE

## 2020-05-20 SURGERY — CORONARY/GRAFT ACUTE MI REVASCULARIZATION
Anesthesia: Moderate Sedation

## 2020-05-20 MED ORDER — HEPARIN SODIUM (PORCINE) 1000 UNIT/ML IJ SOLN
INTRAMUSCULAR | Status: AC
  Filled 2020-05-20: qty 1

## 2020-05-20 MED ORDER — ONDANSETRON HCL 4 MG/2ML IJ SOLN
INTRAMUSCULAR | Status: DC | PRN
  Administered 2020-05-20: 4 mg via INTRAVENOUS

## 2020-05-20 MED ORDER — CHLORHEXIDINE GLUCONATE CLOTH 2 % EX PADS: 6.0000 | MEDICATED_PAD | Freq: Every day | CUTANEOUS | Status: DC

## 2020-05-20 MED ORDER — MIDAZOLAM HCL 2 MG/2ML IJ SOLN
INTRAMUSCULAR | Status: AC
  Filled 2020-05-20: qty 2

## 2020-05-20 MED ORDER — TICAGRELOR 90 MG PO TABS
90.0000 mg | ORAL_TABLET | Freq: Two times a day (BID) | ORAL | Status: DC
  Administered 2020-05-20 – 2020-05-22 (×5): 90 mg via ORAL
  Filled 2020-05-20 (×5): qty 1

## 2020-05-20 MED ORDER — NITROGLYCERIN 0.4 MG SL SUBL
0.4000 mg | SUBLINGUAL_TABLET | SUBLINGUAL | Status: DC | PRN
  Administered 2020-05-20: 01:00:00 0.4 mg via SUBLINGUAL

## 2020-05-20 MED ORDER — TICAGRELOR 90 MG PO TABS
ORAL_TABLET | ORAL | Status: DC | PRN
  Administered 2020-05-20: 180 mg via ORAL

## 2020-05-20 MED ORDER — ASPIRIN 81 MG PO CHEW
81.0000 mg | CHEWABLE_TABLET | Freq: Every day | ORAL | Status: DC
  Administered 2020-05-22: 81 mg via ORAL
  Filled 2020-05-20 (×2): qty 1

## 2020-05-20 MED ORDER — SODIUM CHLORIDE 0.9 % IV SOLN
INTRAVENOUS | Status: AC | PRN
  Administered 2020-05-20: 03:00:00 1.75 mg/kg/h via INTRAVENOUS

## 2020-05-20 MED ORDER — HEPARIN (PORCINE) 25000 UT/250ML-% IV SOLN: 800.0000 [IU]/h | INTRAVENOUS | Status: DC

## 2020-05-20 MED ORDER — NITROGLYCERIN 1 MG/10 ML FOR IR/CATH LAB
INTRA_ARTERIAL | Status: AC
  Filled 2020-05-20: qty 10

## 2020-05-20 MED ORDER — MORPHINE SULFATE (PF) 2 MG/ML IV SOLN
2.0000 mg | INTRAVENOUS | Status: DC | PRN
  Administered 2020-05-21 (×2): 2 mg via INTRAVENOUS
  Filled 2020-05-20 (×2): qty 1

## 2020-05-20 MED ORDER — VERAPAMIL HCL 2.5 MG/ML IV SOLN
INTRAVENOUS | Status: AC
  Filled 2020-05-20: qty 2

## 2020-05-20 MED ORDER — TICAGRELOR 90 MG PO TABS
ORAL_TABLET | ORAL | Status: AC
  Filled 2020-05-20: qty 2

## 2020-05-20 MED ORDER — MIDAZOLAM HCL 2 MG/2ML IJ SOLN
INTRAMUSCULAR | Status: DC | PRN
  Administered 2020-05-20 (×2): 0.5 mg via INTRAVENOUS

## 2020-05-20 MED ORDER — SODIUM CHLORIDE 0.9 % IV SOLN: 250.0000 mL | INTRAVENOUS | Status: DC | PRN

## 2020-05-20 MED ORDER — SODIUM CHLORIDE 0.9% FLUSH
3.0000 mL | Freq: Two times a day (BID) | INTRAVENOUS | Status: DC
  Administered 2020-05-20 – 2020-05-22 (×5): 3 mL via INTRAVENOUS

## 2020-05-20 MED ORDER — METOPROLOL TARTRATE 25 MG PO TABS
25.0000 mg | ORAL_TABLET | Freq: Two times a day (BID) | ORAL | Status: DC
  Administered 2020-05-20 – 2020-05-22 (×5): 25 mg via ORAL
  Filled 2020-05-20 (×6): qty 1

## 2020-05-20 MED ORDER — ONDANSETRON HCL 4 MG/2ML IJ SOLN
INTRAMUSCULAR | Status: AC
  Filled 2020-05-20: qty 2

## 2020-05-20 MED ORDER — HEPARIN (PORCINE) IN NACL 1000-0.9 UT/500ML-% IV SOLN
INTRAVENOUS | Status: AC
  Filled 2020-05-20: qty 1000

## 2020-05-20 MED ORDER — INSULIN ASPART 100 UNIT/ML ~~LOC~~ SOLN
0.0000 [IU] | Freq: Three times a day (TID) | SUBCUTANEOUS | Status: DC
  Administered 2020-05-20 (×4): 2 [IU] via SUBCUTANEOUS
  Administered 2020-05-21: 1 [IU] via SUBCUTANEOUS
  Administered 2020-05-21 (×2): 2 [IU] via SUBCUTANEOUS
  Administered 2020-05-22: 5 [IU] via SUBCUTANEOUS
  Administered 2020-05-22: 1 [IU] via SUBCUTANEOUS
  Filled 2020-05-20 (×9): qty 1

## 2020-05-20 MED ORDER — ACETAMINOPHEN 325 MG PO TABS: 650.0000 mg | ORAL_TABLET | ORAL | Status: DC | PRN

## 2020-05-20 MED ORDER — HEPARIN SODIUM (PORCINE) 1000 UNIT/ML IJ SOLN
INTRAMUSCULAR | Status: DC | PRN
  Administered 2020-05-20: 3000 [IU] via INTRAVENOUS
  Administered 2020-05-20: 4000 [IU] via INTRAVENOUS

## 2020-05-20 MED ORDER — NITROGLYCERIN 1 MG/10 ML FOR IR/CATH LAB
INTRA_ARTERIAL | Status: DC | PRN
  Administered 2020-05-20 (×2): 200 ug via INTRACORONARY
  Administered 2020-05-20: 300 ug via INTRACORONARY

## 2020-05-20 MED ORDER — BIVALIRUDIN TRIFLUOROACETATE 250 MG IV SOLR
INTRAVENOUS | Status: AC
  Filled 2020-05-20: qty 250

## 2020-05-20 MED ORDER — ATORVASTATIN CALCIUM 80 MG PO TABS
80.0000 mg | ORAL_TABLET | Freq: Every day | ORAL | Status: DC
  Administered 2020-05-20 – 2020-05-22 (×3): 80 mg via ORAL
  Filled 2020-05-20: qty 4
  Filled 2020-05-20 (×2): qty 1
  Filled 2020-05-20: qty 4
  Filled 2020-05-20: qty 1

## 2020-05-20 MED ORDER — HEPARIN SODIUM (PORCINE) 5000 UNIT/ML IJ SOLN
4000.0000 [IU] | Freq: Once | INTRAMUSCULAR | Status: AC
  Administered 2020-05-20: 01:00:00 4000 [IU] via INTRAVENOUS

## 2020-05-20 MED ORDER — FENTANYL CITRATE (PF) 100 MCG/2ML IJ SOLN
INTRAMUSCULAR | Status: AC
  Filled 2020-05-20: qty 2

## 2020-05-20 MED ORDER — SODIUM CHLORIDE 0.9 % WEIGHT BASED INFUSION
1.0000 mL/kg/h | INTRAVENOUS | Status: AC
  Administered 2020-05-20: 1 mL/kg/h via INTRAVENOUS

## 2020-05-20 MED ORDER — HEPARIN (PORCINE) IN NACL 2000-0.9 UNIT/L-% IV SOLN
INTRAVENOUS | Status: DC | PRN
  Administered 2020-05-20: 1000 mL

## 2020-05-20 MED ORDER — BIVALIRUDIN BOLUS VIA INFUSION - CUPID
INTRAVENOUS | Status: DC | PRN
  Administered 2020-05-20: 02:00:00 51 mg via INTRAVENOUS

## 2020-05-20 MED ORDER — HYDRALAZINE HCL 20 MG/ML IJ SOLN: 10.0000 mg | INTRAMUSCULAR | Status: AC | PRN

## 2020-05-20 MED ORDER — LABETALOL HCL 5 MG/ML IV SOLN: 10.0000 mg | INTRAVENOUS | Status: AC | PRN

## 2020-05-20 MED ORDER — ONDANSETRON HCL 4 MG/2ML IJ SOLN
4.0000 mg | Freq: Four times a day (QID) | INTRAMUSCULAR | Status: DC | PRN
  Administered 2020-05-20 – 2020-05-21 (×3): 4 mg via INTRAVENOUS
  Filled 2020-05-20 (×3): qty 2

## 2020-05-20 MED ORDER — LISINOPRIL 5 MG PO TABS
2.5000 mg | ORAL_TABLET | Freq: Every day | ORAL | Status: DC
  Administered 2020-05-20 – 2020-05-22 (×3): 2.5 mg via ORAL
  Filled 2020-05-20 (×3): qty 1

## 2020-05-20 MED ORDER — IOHEXOL 300 MG/ML  SOLN
INTRAMUSCULAR | Status: DC | PRN
  Administered 2020-05-20: 03:00:00 410 mL

## 2020-05-20 MED ORDER — VERAPAMIL HCL 2.5 MG/ML IV SOLN
INTRAVENOUS | Status: DC | PRN
  Administered 2020-05-20: 2.5 mg via INTRA_ARTERIAL

## 2020-05-20 MED ORDER — SODIUM CHLORIDE 0.9% FLUSH: 3.0000 mL | INTRAVENOUS | Status: DC | PRN

## 2020-05-20 MED ORDER — FENTANYL CITRATE (PF) 100 MCG/2ML IJ SOLN
INTRAMUSCULAR | Status: DC | PRN
  Administered 2020-05-20 (×2): 25 ug via INTRAVENOUS

## 2020-05-20 SURGICAL SUPPLY — 26 items
BALLN MINITREK RX 2.0X12 (BALLOONS) ×3
BALLN TREK RX 2.25X12 (BALLOONS) ×3
BALLOON MINITREK RX 2.0X12 (BALLOONS) ×1 IMPLANT
BALLOON TREK RX 2.25X12 (BALLOONS) ×1 IMPLANT
CATH INFINITI 5 FR JL3.5 (CATHETERS) ×3 IMPLANT
CATH INFINITI 5FR ANG PIGTAIL (CATHETERS) ×3 IMPLANT
CATH INFINITI 5FR JL4 (CATHETERS) ×3 IMPLANT
CATH INFINITI JR4 5F (CATHETERS) ×3 IMPLANT
CATH VISTA GUIDE 6FR XB3.5 (CATHETERS) ×3 IMPLANT
DEVICE CLOSURE MYNXGRIP 6/7F (Vascular Products) ×3 IMPLANT
DEVICE INFLAT 30 PLUS (MISCELLANEOUS) ×3 IMPLANT
DEVICE RAD TR BAND REGULAR (VASCULAR PRODUCTS) ×3 IMPLANT
DEVICE SAFEGUARD 24CM (GAUZE/BANDAGES/DRESSINGS) ×3 IMPLANT
GLIDESHEATH SLEND SS 6F .021 (SHEATH) ×3 IMPLANT
GUIDEWIRE INQWIRE 1.5J.035X260 (WIRE) ×1 IMPLANT
INQWIRE 1.5J .035X260CM (WIRE) ×3
KIT MANI 3VAL PERCEP (MISCELLANEOUS) ×3 IMPLANT
NEEDLE PERC 18GX7CM (NEEDLE) ×3 IMPLANT
PACK CARDIAC CATH (CUSTOM PROCEDURE TRAY) ×3 IMPLANT
SHEATH AVANTI 6FR X 11CM (SHEATH) ×3 IMPLANT
STENT RESOLUTE ONYX 2.0X15 (Permanent Stent) ×3 IMPLANT
STENT RESOLUTE ONYX 2.25X12 (Permanent Stent) ×3 IMPLANT
STENT RESOLUTE ONYX 2.75X18 (Permanent Stent) ×3 IMPLANT
WIRE G HI TQ BMW 190 (WIRE) ×6 IMPLANT
WIRE GUIDERIGHT .035X150 (WIRE) ×3 IMPLANT
WIRE HITORQ VERSACORE ST 145CM (WIRE) ×3 IMPLANT

## 2020-05-20 NOTE — Consult Note (Signed)
[facility] Cardiology  CARDIOLOGY CONSULT NOTE  Patient ID: Emily Jones MRN: [id number] DOB/AGE: 10/12/1940 79 y.o.  Admit date: 05/20/2020 Referring Physician Alfred Levins Primary Physician [facility] Primary Cardiologist  Reason for Consultation inferior STEMI  HPI: 79 year old female referred for evaluation of inferior STEMI.  The patient presented to [facility] ED with new onset substernal chest pain.  The patient described on and off chest pain, rated 4 out of 10.  Initial EKG was felt to be nondiagnostic.  Initial high-sensitivity troponin was 60.  Second troponin was 185.  Repeat ECG revealed ST elevations in the inferior lateral leads consistent with inferolateral STEMI.  The patient was still experiencing 2-4 out of 10 substernal chest pain.  Review of systems complete and found to be negative unless listed above     Past Medical History:  Diagnosis Date  . Allergic state   . Costochondritis   . Depression   . Diabetes mellitus without complication ([place])   . Esophagitis   . GERD (gastroesophageal reflux disease)   . Headache   . Hemorrhoids   . Hyperlipidemia   . Hypertension   . Panic attacks      Medications Prior to Admission  Medication Sig Dispense Refill Last Dose  . acetaminophen (TYLENOL) 325 MG tablet Take by mouth every 6 (six) hours as needed.     . clonazePAM (KLONOPIN) 0.5 MG tablet Take 0.25 mg by mouth daily.     . meclizine (ANTIVERT) 25 MG tablet Take 25 mg by mouth 3 (three) times daily as needed for dizziness.     . mupirocin ointment (BACTROBAN) 2 % Place 1 application into the nose 2 (two) times daily.     Marland Kitchen omega-3 acid ethyl esters (LOVAZA) 1 g capsule Take 1 g by mouth daily.     Marland Kitchen omeprazole (PRILOSEC) 20 MG capsule Take 20 mg by mouth daily.     . sertraline (ZOLOFT) 100 MG tablet Take 100 mg by mouth daily.     Marland Kitchen VITAMIN C, CALCIUM ASCORBATE, PO Take 1 tablet by mouth daily.      Social History   Socioeconomic History  . Marital status: Married     Spouse name: Not on file  . Number of children: Not on file  . Years of education: Not on file  . Highest education level: Not on file  Occupational History  . Not on file  Tobacco Use  . Smoking status: Never Smoker  . Smokeless tobacco: Never Used  Substance and Sexual Activity  . Alcohol use: No  . Drug use: No  . Sexual activity: Not on file  Other Topics Concern  . Not on file  Social History Narrative  . Not on file   Social Determinants of Health   Financial Resource Strain:   . Difficulty of Paying Living Expenses: Not on file  Food Insecurity:   . Worried About Charity fundraiser in the Last Year: Not on file  . Ran Out of Food in the Last Year: Not on file  Transportation Needs:   . Lack of Transportation (Medical): Not on file  . Lack of Transportation (Non-Medical): Not on file  Physical Activity:   . Days of Exercise per Week: Not on file  . Minutes of Exercise per Session: Not on file  Stress:   . Feeling of Stress : Not on file  Social Connections:   . Frequency of Communication with Friends and Family: Not on file  . Frequency of Social Gatherings with  Friends and Family: Not on file  . Attends Religious Services: Not on file  . Active Member of Clubs or Organizations: Not on file  . Attends Archivist Meetings: Not on file  . Marital Status: Not on file  Intimate Partner Violence:   . Fear of Current or Ex-Partner: Not on file  . Emotionally Abused: Not on file  . Physically Abused: Not on file  . Sexually Abused: Not on file    No family history on file.    Review of systems complete and found to be negative unless listed above      PHYSICAL EXAM  General: Well developed, well nourished, in no acute distress HEENT:  Normocephalic and atramatic Neck:  No JVD.  Lungs: Clear bilaterally to auscultation and percussion. Heart: HRRR . Normal S1 and S2 without gallops or murmurs.  Abdomen: Bowel sounds are positive, abdomen soft and  non-tender  Msk:  Back normal, normal gait. Normal strength and tone for age. Extremities: No clubbing, cyanosis or edema.   Neuro: Alert and oriented X 3. Psych:  Good affect, responds appropriately  Labs:   Lab Results  Component Value Date   WBC 6.2 05/19/2020   HGB 12.9 05/19/2020   HCT 37.9 05/19/2020   MCV 80.0 05/19/2020   PLT 181 05/19/2020    Recent Labs  Lab 05/19/20 2007  NA 134*  K 5.0  CL 97*  CO2 23  BUN 19  CREATININE 0.97  CALCIUM 9.3  PROT 7.8  BILITOT 1.1  ALKPHOS 119  ALT 17  AST 19  GLUCOSE 327*   No results found for: CKTOTAL, CKMB, CKMBINDEX, TROPONINI No results found for: CHOL No results found for: HDL No results found for: LDLCALC No results found for: TRIG No results found for: CHOLHDL No results found for: LDLDIRECT    Radiology: DG Chest 2 View  Result Date: 05/19/2020 CLINICAL DATA:  Central chest pain for several hours EXAM: CHEST - 2 VIEW COMPARISON:  None. FINDINGS: Cardiac shadow is within normal limits. Aortic calcifications are seen. Lungs are well aerated bilaterally. No acute bony abnormality is seen. IMPRESSION: No acute abnormality noted. Electronically Signed   By: Inez Catalina M.D.   On: 05/19/2020 20:32   CARDIAC CATHETERIZATION  Result Date: 05/20/2020  1st Mrg lesion is 100% stenosed.  Mid Cx lesion is 95% stenosed.  Ost Cx to Prox Cx lesion is 90% stenosed.  Mid LAD lesion is 50% stenosed.  Mid LAD to Dist LAD lesion is 40% stenosed.  Prox RCA lesion is 30% stenosed.  Dist RCA lesion is 40% stenosed.  RPAV lesion is 50% stenosed.  A drug-eluting stent was successfully placed using a STENT RESOLUTE ONYX 2.0X15.  Post intervention, there is a 0% residual stenosis.  A drug-eluting stent was successfully placed using a [place] 6.21H08.  Post intervention, there is a 0% residual stenosis.  A drug-eluting stent was successfully placed using a [place] [id number].  Post intervention, there is a 0%  residual stenosis.  1.  Inferior STEMI 2.  Acute occlusion of OM1 with high-grade 95% stenosis mid left circumflex, and ulcerated 90% stenosis proximal left circumflex 3.  Mildly reduced left ventricular function, with estimated LVEF 45 to 50%, with inferoapical hypokinesis 4.  Successful primary PCI with DES OM1, mid left circumflex, and proximal left circumflex Recommendations 1.  Dual antiplatelet therapy uninterrupted for 1 year 2.  Start high intensity atorvastatin 3.  Start metoprolol tartrate 25 mg twice daily  4.  2D echocardiogram    EKG: Sinus rhythm with ST elevation in leads II, III and aVF and V5 and V6  ASSESSMENT AND PLAN:   1.  Inferolateral STEMI  Recommendations  Emergent cardiac catheterization and probable primary PCI  Signed: Isaias Cowman MD,PhD, [facility] 05/20/2020, 3:31 AM

## 2020-05-20 NOTE — Progress Notes (Signed)
[place] Progress Note Patient Name: Emily Jones DOB: 01/31/1941 MRN: [id number]   Date of Service  05/20/2020  HPI/Events of Note  Patient admitted with infero-lateral STEMI.  eICU Interventions  New Patient Evaluation completed.        Kerry Kass Shad Ledvina 05/20/2020, 4:17 AM

## 2020-05-20 NOTE — Consult Note (Signed)
Emily Jones is a 79 y.o. female  [id number]  Primary Cardiologist: Neoma Laming Reason for Consultation: Status post PCI and stenting after STEMI.  HPI: This is a 79 year old white female who had STEMI last night and had PCI and stenting and right now is only complaining of some right wrist pain from where procedure was done and some nausea.   Review of Systems: No chest pain.   Past Medical History:  Diagnosis Date  . Allergic state   . Costochondritis   . Depression   . Diabetes mellitus without complication ([place])   . Esophagitis   . GERD (gastroesophageal reflux disease)   . Headache   . Hemorrhoids   . Hyperlipidemia   . Hypertension   . Panic attacks     Medications Prior to Admission  Medication Sig Dispense Refill  . acetaminophen (TYLENOL) 325 MG tablet Take by mouth every 6 (six) hours as needed.    . clonazePAM (KLONOPIN) 0.5 MG tablet Take 0.25 mg by mouth daily.    . meclizine (ANTIVERT) 25 MG tablet Take 25 mg by mouth 3 (three) times daily as needed for dizziness.    . mupirocin ointment (BACTROBAN) 2 % Place 1 application into the nose 2 (two) times daily.    Marland Kitchen omega-3 acid ethyl esters (LOVAZA) 1 g capsule Take 1 g by mouth daily.    Marland Kitchen omeprazole (PRILOSEC) 20 MG capsule Take 20 mg by mouth daily.    . sertraline (ZOLOFT) 100 MG tablet Take 100 mg by mouth daily.    Marland Kitchen VITAMIN C, CALCIUM ASCORBATE, PO Take 1 tablet by mouth daily.       Marland Kitchen aspirin  81 mg Oral Daily  . atorvastatin  80 mg Oral Daily  . Chlorhexidine Gluconate Cloth  6 each Topical Daily  . insulin aspart  0-9 Units Subcutaneous TID PC & HS  . lisinopril  2.5 mg Oral Daily  . metoprolol tartrate  25 mg Oral BID  . sodium chloride flush  3 mL Intravenous Q12H  . ticagrelor  90 mg Oral BID    Infusions: . sodium chloride    . sodium chloride 1 mL/kg/hr (05/20/20 0405)    Allergies  Allergen Reactions  . Aspirin   . Chlorpheniramine Maleate   . Codeine   . Statins Other (See  Comments)    Muscle Spasms  . Sulfa Antibiotics   . Penicillins Rash    Social History   Socioeconomic History  . Marital status: Married    Spouse name: Not on file  . Number of children: Not on file  . Years of education: Not on file  . Highest education level: Not on file  Occupational History  . Not on file  Tobacco Use  . Smoking status: Never Smoker  . Smokeless tobacco: Never Used  Substance and Sexual Activity  . Alcohol use: No  . Drug use: No  . Sexual activity: Not on file  Other Topics Concern  . Not on file  Social History Narrative  . Not on file   Social Determinants of Health   Financial Resource Strain:   . Difficulty of Paying Living Expenses: Not on file  Food Insecurity:   . Worried About Charity fundraiser in the Last Year: Not on file  . Ran Out of Food in the Last Year: Not on file  Transportation Needs:   . Lack of Transportation (Medical): Not on file  . Lack of Transportation (Non-Medical): Not on  file  Physical Activity:   . Days of Exercise per Week: Not on file  . Minutes of Exercise per Session: Not on file  Stress:   . Feeling of Stress : Not on file  Social Connections:   . Frequency of Communication with Friends and Family: Not on file  . Frequency of Social Gatherings with Friends and Family: Not on file  . Attends Religious Services: Not on file  . Active Member of Clubs or Organizations: Not on file  . Attends Archivist Meetings: Not on file  . Marital Status: Not on file  Intimate Partner Violence:   . Fear of Current or Ex-Partner: Not on file  . Emotionally Abused: Not on file  . Physically Abused: Not on file  . Sexually Abused: Not on file    No family history on file.  PHYSICAL EXAM: Vitals:   05/20/20 0700 05/20/20 0800  BP: (!) 141/69 128/88  Pulse: 70 73  Resp: 12 20  Temp:  98.6 F (37 C)  SpO2: 98% 97%    No intake or output data in the 24 hours ending 05/20/20 0848  General:  Well  appearing. No respiratory difficulty HEENT: normal Neck: supple. no JVD. Carotids 2+ bilat; no bruits. No lymphadenopathy or thryomegaly appreciated. Cor: PMI nondisplaced. Regular rate & rhythm. No rubs, gallops or murmurs. Lungs: clear Abdomen: soft, nontender, nondistended. No hepatosplenomegaly. No bruits or masses. Good bowel sounds. Extremities: no cyanosis, clubbing, rash, edema Neuro: alert & oriented x 3, cranial nerves grossly intact. moves all 4 extremities w/o difficulty. Affect pleasant.  ECG: Anterolateral ST elevation on EKG  Results for orders placed or performed during the hospital encounter of 05/20/20 (from the past 24 hour(s))  CBC     Status: None   Collection Time: 05/19/20  8:07 PM  Result Value Ref Range   WBC 6.2 4.0 - 10.5 K/uL   RBC 4.74 3.87 - 5.11 MIL/uL   Hemoglobin 12.9 12.0 - 15.0 g/dL   HCT 37.9 36 - 46 %   MCV 80.0 80.0 - 100.0 fL   MCH 27.2 26.0 - 34.0 pg   MCHC 34.0 30.0 - 36.0 g/dL   RDW 15.1 11.5 - 15.5 %   Platelets 181 150 - 400 K/uL   nRBC 0.0 0.0 - 0.2 %  Comprehensive metabolic panel     Status: Abnormal   Collection Time: 05/19/20  8:07 PM  Result Value Ref Range   Sodium 134 (L) 135 - 145 mmol/L   Potassium 5.0 3.5 - 5.1 mmol/L   Chloride 97 (L) 98 - 111 mmol/L   CO2 23 22 - 32 mmol/L   Glucose, Bld 327 (H) 70 - 99 mg/dL   BUN 19 8 - 23 mg/dL   Creatinine, Ser 0.97 0.44 - 1.00 mg/dL   Calcium 9.3 8.9 - 10.3 mg/dL   Total Protein 7.8 6.5 - 8.1 g/dL   Albumin 4.5 3.5 - 5.0 g/dL   AST 19 15 - 41 U/L   ALT 17 0 - 44 U/L   Alkaline Phosphatase 119 38 - 126 U/L   Total Bilirubin 1.1 0.3 - 1.2 mg/dL   GFR calc non Af Amer 56 (L) >60 mL/min   GFR calc Af Amer >60 >60 mL/min   Anion gap 14 5 - 15  Troponin I (High Sensitivity)     Status: Abnormal   Collection Time: 05/19/20  8:07 PM  Result Value Ref Range   Troponin I (High Sensitivity) 60 (H) <  18 ng/L  Troponin I (High Sensitivity)     Status: Abnormal   Collection Time:  05/19/20 10:10 PM  Result Value Ref Range   Troponin I (High Sensitivity) 185 (HH) <18 ng/L  Respiratory Panel by RT PCR (Flu A&B, Covid) - Nasopharyngeal Swab     Status: None   Collection Time: 05/20/20 12:49 AM   Specimen: Nasopharyngeal Swab  Result Value Ref Range   SARS Coronavirus 2 by RT PCR NEGATIVE NEGATIVE   Influenza A by PCR NEGATIVE NEGATIVE   Influenza B by PCR NEGATIVE NEGATIVE  POCT Activated clotting time     Status: None   Collection Time: 05/20/20  1:50 AM  Result Value Ref Range   Activated Clotting Time 285 seconds  POCT Activated clotting time     Status: None   Collection Time: 05/20/20  2:02 AM  Result Value Ref Range   Activated Clotting Time 345 seconds  POCT Activated clotting time     Status: None   Collection Time: 05/20/20  2:22 AM  Result Value Ref Range   Activated Clotting Time 290 seconds  MRSA PCR Screening     Status: None   Collection Time: 05/20/20  3:30 AM   Specimen: Nasopharyngeal  Result Value Ref Range   MRSA by PCR NEGATIVE NEGATIVE  Glucose, capillary     Status: Abnormal   Collection Time: 05/20/20  3:50 AM  Result Value Ref Range   Glucose-Capillary 127 (H) 70 - 99 mg/dL  Basic metabolic panel     Status: Abnormal   Collection Time: 05/20/20  5:40 AM  Result Value Ref Range   Sodium 130 (L) 135 - 145 mmol/L   Potassium 4.5 3.5 - 5.1 mmol/L   Chloride 96 (L) 98 - 111 mmol/L   CO2 23 22 - 32 mmol/L   Glucose, Bld 202 (H) 70 - 99 mg/dL   BUN 17 8 - 23 mg/dL   Creatinine, Ser 0.82 0.44 - 1.00 mg/dL   Calcium 8.8 (L) 8.9 - 10.3 mg/dL   GFR calc non Af Amer >60 >60 mL/min   GFR calc Af Amer >60 >60 mL/min   Anion gap 11 5 - 15  CBC     Status: Abnormal   Collection Time: 05/20/20  5:40 AM  Result Value Ref Range   WBC 7.5 4.0 - 10.5 K/uL   RBC 4.38 3.87 - 5.11 MIL/uL   Hemoglobin 12.0 12.0 - 15.0 g/dL   HCT 34.6 (L) 36 - 46 %   MCV 79.0 (L) 80.0 - 100.0 fL   MCH 27.4 26.0 - 34.0 pg   MCHC 34.7 30.0 - 36.0 g/dL   RDW 15.3  11.5 - 15.5 %   Platelets 173 150 - 400 K/uL   nRBC 0.0 0.0 - 0.2 %  Glucose, capillary     Status: Abnormal   Collection Time: 05/20/20  8:03 AM  Result Value Ref Range   Glucose-Capillary 184 (H) 70 - 99 mg/dL   DG Chest 2 View  Result Date: 05/19/2020 CLINICAL DATA:  Central chest pain for several hours EXAM: CHEST - 2 VIEW COMPARISON:  None. FINDINGS: Cardiac shadow is within normal limits. Aortic calcifications are seen. Lungs are well aerated bilaterally. No acute bony abnormality is seen. IMPRESSION: No acute abnormality noted. Electronically Signed   By: Inez Catalina M.D.   On: 05/19/2020 20:32   CARDIAC CATHETERIZATION  Result Date: 05/20/2020  1st Mrg lesion is 100% stenosed.  Mid Cx lesion is 95% stenosed.  Ost Cx to Prox Cx lesion is 90% stenosed.  Mid LAD lesion is 50% stenosed.  Mid LAD to Dist LAD lesion is 40% stenosed.  Prox RCA lesion is 30% stenosed.  Dist RCA lesion is 40% stenosed.  RPAV lesion is 50% stenosed.  A drug-eluting stent was successfully placed using a STENT RESOLUTE ONYX 2.0X15.  Post intervention, there is a 0% residual stenosis.  A drug-eluting stent was successfully placed using a [place] 9.03E09.  Post intervention, there is a 0% residual stenosis.  A drug-eluting stent was successfully placed using a [place] [id number].  Post intervention, there is a 0% residual stenosis.  1.  Inferior STEMI 2.  Acute occlusion of OM1 with high-grade 95% stenosis mid left circumflex, and ulcerated 90% stenosis proximal left circumflex 3.  Mildly reduced left ventricular function, with estimated LVEF 45 to 50%, with inferoapical hypokinesis 4.  Successful primary PCI with DES OM1, mid left circumflex, and proximal left circumflex Recommendations 1.  Dual antiplatelet therapy uninterrupted for 1 year 2.  Start high intensity atorvastatin 3.  Start metoprolol tartrate 25 mg twice daily 4.  2D echocardiogram     ASSESSMENT AND PLAN: Status post  anterolateral myocardial infarction and PCI and stenting.  Patient is right now comfortable will follow the patient. Patient had a successful PCI of OM1 and mid left circumflex and ejection fraction is 45 to 50%. Blessings Inglett A

## 2020-05-20 NOTE — Progress Notes (Signed)
[facility]  SUBJECTIVE: Patient denies chest pain   Vitals:   05/20/20 0400 05/20/20 0432 05/20/20 0700 05/20/20 0800  BP: (!) 152/71 (!) 149/81 (!) 141/69 128/88  Pulse: 77 80 70 73  Resp: (!) 8  12 20   Temp:    98.6 F (37 C)  TempSrc:    Oral  SpO2: 99%  98% 97%  Weight:      Height:        No intake or output data in the 24 hours ending 05/20/20 0838    PHYSICAL EXAM  General: Well developed, well nourished, in no acute distress HEENT:  Normocephalic and atramatic Neck:  No JVD.  Lungs: Clear bilaterally to auscultation and percussion. Heart: HRRR . Normal S1 and S2 without gallops or murmurs.  Abdomen: Bowel sounds are positive, abdomen soft and non-tender  Msk:  Back normal, normal gait. Normal strength and tone for age. Extremities: No clubbing, cyanosis or edema.   Neuro: Alert and oriented X 3. Psych:  Good affect, responds appropriately   LABS: Basic Metabolic Panel: Recent Labs    05/19/20 2007 05/20/20 0540  NA 134* 130*  K 5.0 4.5  CL 97* 96*  CO2 23 23  GLUCOSE 327* 202*  BUN 19 17  CREATININE 0.97 0.82  CALCIUM 9.3 8.8*   Liver Function Tests: Recent Labs    05/19/20 2007  AST 19  ALT 17  ALKPHOS 119  BILITOT 1.1  PROT 7.8  ALBUMIN 4.5   No results for input(s): LIPASE, AMYLASE in the last 72 hours. CBC: Recent Labs    05/19/20 2007 05/20/20 0540  WBC 6.2 7.5  HGB 12.9 12.0  HCT 37.9 34.6*  MCV 80.0 79.0*  PLT 181 173   Cardiac Enzymes: No results for input(s): CKTOTAL, CKMB, CKMBINDEX, TROPONINI in the last 72 hours. BNP: Invalid input(s): POCBNP D-Dimer: No results for input(s): DDIMER in the last 72 hours. Hemoglobin A1C: No results for input(s): HGBA1C in the last 72 hours. Fasting Lipid Panel: No results for input(s): CHOL, HDL, LDLCALC, TRIG, CHOLHDL, LDLDIRECT in the last 72 hours. Thyroid Function Tests: No results for input(s): TSH, T4TOTAL, T3FREE, THYROIDAB in the last 72 hours.  Invalid input(s):  FREET3 Anemia Panel: No results for input(s): VITAMINB12, FOLATE, FERRITIN, TIBC, IRON, RETICCTPCT in the last 72 hours.  DG Chest 2 View  Result Date: 05/19/2020 CLINICAL DATA:  Central chest pain for several hours EXAM: CHEST - 2 VIEW COMPARISON:  None. FINDINGS: Cardiac shadow is within normal limits. Aortic calcifications are seen. Lungs are well aerated bilaterally. No acute bony abnormality is seen. IMPRESSION: No acute abnormality noted. Electronically Signed   By: Inez Catalina M.D.   On: 05/19/2020 20:32   CARDIAC CATHETERIZATION  Result Date: 05/20/2020  1st Mrg lesion is 100% stenosed.  Mid Cx lesion is 95% stenosed.  Ost Cx to Prox Cx lesion is 90% stenosed.  Mid LAD lesion is 50% stenosed.  Mid LAD to Dist LAD lesion is 40% stenosed.  Prox RCA lesion is 30% stenosed.  Dist RCA lesion is 40% stenosed.  RPAV lesion is 50% stenosed.  A drug-eluting stent was successfully placed using a STENT RESOLUTE ONYX 2.0X15.  Post intervention, there is a 0% residual stenosis.  A drug-eluting stent was successfully placed using a [place] 1.93X90.  Post intervention, there is a 0% residual stenosis.  A drug-eluting stent was successfully placed using a [place] [id number].  Post intervention, there is a 0% residual stenosis.  1.  Inferior STEMI 2.  Acute occlusion of OM1 with high-grade 95% stenosis mid left circumflex, and ulcerated 90% stenosis proximal left circumflex 3.  Mildly reduced left ventricular function, with estimated LVEF 45 to 50%, with inferoapical hypokinesis 4.  Successful primary PCI with DES OM1, mid left circumflex, and proximal left circumflex Recommendations 1.  Dual antiplatelet therapy uninterrupted for 1 year 2.  Start high intensity atorvastatin 3.  Start metoprolol tartrate 25 mg twice daily 4.  2D echocardiogram     Echo pending  TELEMETRY: Sinus:  ASSESSMENT AND PLAN:  Active Problems:   STEMI involving oth coronary artery of inferior  wall (HCC)   STEMI (ST elevation myocardial infarction) ([place])   1. Inferolateral STEMI, occluded OM1, s/p DES OM1, mid LCx, prox LCx, no recurrent chest pain 2. Type II DM per Dr. Reesa Chew  Recommendations  1. DAPT uninterrupted for one year 2. High intensity atorvastain 3. Continue metoprolol tartrate 4. Add low dose ACEI 5. Review 2D echo 6. Dr. Humphrey Rolls to follow this weekend 7. Follow up with after discharge in one week   Emily Cowman, MD, PhD, [facility] 05/20/2020 8:38 AM

## 2020-05-20 NOTE — ED Provider Notes (Signed)
[facility] Emergency Department Provider Note  ____________________________________________  Time seen: Approximately 12:58 AM  I have reviewed the triage vital signs and the nursing notes.   HISTORY  Chief Complaint Chest Pain   HPI Emily Jones is a 79 y.o. female who presents for evaluation of chest pain.  Patient reports that she was recently diagnosed with hypertension and diabetes but not started on any medications.  She was also told that her cholesterol was elevated.  Patient reports that she started having pain at 4 PM.  She describes the pain as dull located in the center of her chest radiating to both her arms associated with nausea and vomiting.  The pain has been intermittent but it became severe this evening which prompted her to call 911.  She denies any personal history of heart attacks.  She has had family history of heart disease in her brother.  She is not a smoker.  She denies any prior history of PE or DVT, no recent travel immobilization, no leg pain or swelling, no hemoptysis or exogenous hormones.  The pain does not radiate to her back.   She received a full aspirin per EMS and 2 nitro sprays.  The pain is currently 4/10.  Past Medical History:  Diagnosis Date  . Allergic state   . Costochondritis   . Depression   . Diabetes mellitus without complication ([place])   . Esophagitis   . GERD (gastroesophageal reflux disease)   . Headache   . Hemorrhoids   . Hyperlipidemia   . Hypertension   . Panic attacks     Patient Active Problem List   Diagnosis Date Noted  . STEMI involving oth coronary artery of inferior wall ([place]) 05/20/2020  . STEMI (ST elevation myocardial infarction) ([place]) 05/20/2020     Prior to Admission medications   Medication Sig Start Date End Date Taking? Authorizing Provider  acetaminophen (TYLENOL) 325 MG tablet Take by mouth every 6 (six) hours as needed.    [provider]  clonazePAM (KLONOPIN) 0.5  MG tablet Take 0.25 mg by mouth daily.    [provider]  meclizine (ANTIVERT) 25 MG tablet Take 25 mg by mouth 3 (three) times daily as needed for dizziness.    [provider]  mupirocin ointment (BACTROBAN) 2 % Place 1 application into the nose 2 (two) times daily.    [provider]  omega-3 acid ethyl esters (LOVAZA) 1 g capsule Take 1 g by mouth daily.    [provider]  omeprazole (PRILOSEC) 20 MG capsule Take 20 mg by mouth daily.    [provider]  sertraline (ZOLOFT) 100 MG tablet Take 100 mg by mouth daily.    [provider]  VITAMIN C, CALCIUM ASCORBATE, PO Take 1 tablet by mouth daily.    [provider]    Allergies Aspirin, Chlorpheniramine maleate, Codeine, Statins, Sulfa antibiotics, and Penicillins  No family history on file.  Social History Social History   Tobacco Use  . Smoking status: Never Smoker  . Smokeless tobacco: Never Used  Substance Use Topics  . Alcohol use: No  . Drug use: No    Review of Systems  Constitutional: Negative for fever. Eyes: Negative for visual changes. ENT: Negative for sore throat. Neck: No neck pain  Cardiovascular: + chest pain. Respiratory: Negative for shortness of breath. Gastrointestinal: Negative for abdominal pain or diarrhea. + N/V Genitourinary: Negative for dysuria. Musculoskeletal: Negative for back pain. Skin: Negative for  rash. Neurological: Negative for headaches, weakness or numbness. Psych: No SI or HI  ____________________________________________   PHYSICAL EXAM:  VITAL SIGNS: ED Triage Vitals  Enc Vitals Group     BP 05/19/20 1958 (!) 170/92     Pulse Rate 05/19/20 1958 79     Resp 05/19/20 1958 16     Temp 05/19/20 1958 97.8 F (36.6 C)     Temp Source 05/19/20 1958 Oral     SpO2 05/19/20 1958 99 %     Weight 05/19/20 1953 150 lb (68 kg)     Height 05/19/20 1953 5\' 5"  (1.651 m)     Head Circumference --      Peak Flow --       Pain Score 05/19/20 1953 10     Pain Loc --      Pain Edu? --      Excl. in [place]? --     Constitutional: Alert and oriented. Well appearing and in no apparent distress. HEENT:      Head: Normocephalic and atraumatic.         Eyes: Conjunctivae are normal. Sclera is non-icteric.       Mouth/Throat: Mucous membranes are moist.       Neck: Supple with no signs of meningismus. Cardiovascular: Regular rate and rhythm. No murmurs, gallops, or rubs. 2+ symmetrical distal pulses are present in all extremities. No JVD. Respiratory: Normal respiratory effort. Lungs are clear to auscultation bilaterally. No wheezes, crackles, or rhonchi.  Gastrointestinal: Soft, non tender, and non distended. Musculoskeletal: No edema, cyanosis, or erythema of extremities. Neurologic: Normal speech and language. Face is symmetric. Moving all extremities. No gross focal neurologic deficits are appreciated. Skin: Skin is warm, dry and intact. No rash noted. Psychiatric: Mood and affect are normal. Speech and behavior are normal.  ____________________________________________   LABS (all labs ordered are listed, but only abnormal results are displayed)  Labs Reviewed  COMPREHENSIVE METABOLIC PANEL - Abnormal; Notable for the following components:      Result Value   Sodium 134 (*)    Chloride 97 (*)    Glucose, Bld 327 (*)    GFR calc non Af Amer 56 (*)    All other components within normal limits  GLUCOSE, CAPILLARY - Abnormal; Notable for the following components:   Glucose-Capillary 127 (*)    All other components within normal limits  TROPONIN I (HIGH SENSITIVITY) - Abnormal; Notable for the following components:   Troponin I (High Sensitivity) 60 (*)    All other components within normal limits  TROPONIN I (HIGH SENSITIVITY) - Abnormal; Notable for the following components:   Troponin I (High Sensitivity) 185 (*)    All other components within normal limits  RESPIRATORY PANEL BY RT PCR (FLU A&B, COVID)   MRSA PCR SCREENING  CBC  BASIC METABOLIC PANEL  CBC  POCT ACTIVATED CLOTTING TIME  POCT ACTIVATED CLOTTING TIME  POCT ACTIVATED CLOTTING TIME   ____________________________________________  EKG  ED ECG REPORT I, Rudene Re, the attending physician, personally viewed and interpreted this ECG.   Normal sinus rhythm, rate of 91, ST elevations inferior laterally with depressions in aVL concerning for acute MI. ____________________________________________  RADIOLOGY  I have personally reviewed the images performed during this visit and I agree with the Radiologist's read.   Interpretation by Radiologist:  DG Chest 2 View  Result Date: 05/19/2020 CLINICAL DATA:  Central chest pain for several hours EXAM: CHEST - 2 VIEW COMPARISON:  None. FINDINGS: Cardiac shadow  is within normal limits. Aortic calcifications are seen. Lungs are well aerated bilaterally. No acute bony abnormality is seen. IMPRESSION: No acute abnormality noted. Electronically Signed   By: Inez Catalina M.D.   On: 05/19/2020 20:32   CARDIAC CATHETERIZATION  Result Date: 05/20/2020  1st Mrg lesion is 100% stenosed.  Mid Cx lesion is 95% stenosed.  Ost Cx to Prox Cx lesion is 90% stenosed.  Mid LAD lesion is 50% stenosed.  Mid LAD to Dist LAD lesion is 40% stenosed.  Prox RCA lesion is 30% stenosed.  Dist RCA lesion is 40% stenosed.  RPAV lesion is 50% stenosed.  A drug-eluting stent was successfully placed using a STENT RESOLUTE ONYX 2.0X15.  Post intervention, there is a 0% residual stenosis.  A drug-eluting stent was successfully placed using a [place] [id number].  Post intervention, there is a 0% residual stenosis.  A drug-eluting stent was successfully placed using a [place] [id number].  Post intervention, there is a 0% residual stenosis.  1.  Inferior STEMI 2.  Acute occlusion of OM1 with high-grade 95% stenosis mid left circumflex, and ulcerated 90% stenosis proximal left circumflex  3.  Mildly reduced left ventricular function, with estimated LVEF 45 to 50%, with inferoapical hypokinesis 4.  Successful primary PCI with DES OM1, mid left circumflex, and proximal left circumflex Recommendations 1.  Dual antiplatelet therapy uninterrupted for 1 year 2.  Start high intensity atorvastatin 3.  Start metoprolol tartrate 25 mg twice daily 4.  2D echocardiogram     ____________________________________________   PROCEDURES  Procedure(s) performed:yes .1-3 Lead EKG Interpretation Performed by: Rudene Re, MD Authorized by: Rudene Re, MD     Interpretation: non-specific     ECG rate assessment: normal     Rhythm: sinus rhythm     Ectopy: none     Critical Care performed: yes  CRITICAL CARE Performed by: Rudene Re  ?  Total critical care time: 30 min  Critical care time was exclusive of separately billable procedures and treating other patients.  Critical care was necessary to treat or prevent imminent or life-threatening deterioration.  Critical care was time spent personally by me on the following activities: development of treatment plan with patient and/or surrogate as well as nursing, discussions with consultants, evaluation of patient's response to treatment, examination of patient, obtaining history from patient or surrogate, ordering and performing treatments and interventions, ordering and review of laboratory studies, ordering and review of radiographic studies, pulse oximetry and re-evaluation of patient's condition.  ____________________________________________   INITIAL IMPRESSION / ASSESSMENT AND PLAN / ED COURSE   79 y.o. female who presents for evaluation of chest pain.  Initial EKG done on arrival to the emergency department had not been exported into the system and unable to be located.  Patient was brought to the room because her delta troponin was trending up.  Upon arrival in the room, was noticed on the monitor the patient  had ST elevations and a repeat EKG was done which showed STEMI.  At that time code STEMI was activated.  Patient had already received aspirin per EMS.  She was complaining of 4 out of 10 pain.  She was given 4000 units of heparin and 3 sublingual nitros with resolution of her pain.  Patient was evaluated by Dr. Saralyn Pilar and taken to the cath lab.  Daughter was updated on the phone and then again when she was in the room with patient.  The initial EKG was then located.  It  had been done unfortunately with the wrong last name.  That was fixed.  The EKG was read as normal by Dr. Cherylann Banas at 19:50.  Old medical records reviewed.  History gathered from patient and her daughter, plan discussed with both of them.  Patient left the Cath Lab in stable conditions      _____________________________________________ Please note:  Patient was evaluated in Emergency Department today for the symptoms described in the history of present illness. Patient was evaluated in the context of the global COVID-19 pandemic, which necessitated consideration that the patient might be at risk for infection with the SARS-CoV-2 virus that causes COVID-19. Institutional protocols and algorithms that pertain to the evaluation of patients at risk for COVID-19 are in a state of rapid change based on information released by regulatory bodies including the CDC and federal and state organizations. These policies and algorithms were followed during the patient's care in the ED.  Some ED evaluations and interventions may be delayed as a result of limited staffing during the pandemic.   [place] Controlled Substance Database was reviewed by me. ____________________________________________   FINAL CLINICAL IMPRESSION(S) / ED DIAGNOSES   Final diagnoses:  ST elevation myocardial infarction (STEMI), unspecified artery ([place])      NEW MEDICATIONS STARTED DURING THIS VISIT:  ED Discharge Orders         Ordered    AMB Referral to Cardiac  Rehabilitation - Phase II        05/20/20 0304           Note:  This document was prepared using Dragon voice recognition software and may include unintentional dictation errors.    Alfred Levins, [place], MD 05/20/20 [phone]

## 2020-05-20 NOTE — ED Notes (Signed)
EKG was done signed by [facility].

## 2020-05-20 NOTE — ED Notes (Signed)
2nd troponin still pending; called lab to inquire over results; st will call back

## 2020-05-20 NOTE — Progress Notes (Signed)
[place] paged to ED for STEMI; Pt. in rm. being attended by medical team when [facility] arrived; pt. awake, talkative, shared she is a [place] and has a strong faith when [facility] introduced himself.  Ch helped pt. make phone call to former husband and assisted in bringing pt.'s dtr. bedside when cardiologist arrived; [facility] escorted pt. and pt.'s son-in-law to cath lab waiting area; provided support to dtr.  CH remains available as needed.

## 2020-05-20 NOTE — Progress Notes (Signed)
TR band completely removed-level 0- PAD still in place at this time- level 0- patient has been up to [facility] and recliner today- tolerated well though had bouts of nausea- patient resting comfortably at this time.  Daughter at bedside and updated.

## 2020-05-20 NOTE — ED Notes (Signed)
Lab called to report 2nd trop results; charge nurse notified and pt taken to room 17; assisted into hosp gown & on card monitor

## 2020-05-20 NOTE — Progress Notes (Signed)
*  PRELIMINARY RESULTS* Echocardiogram 2D Echocardiogram has been performed.  Emily Jones 05/20/2020, 4:25 PM

## 2020-05-20 NOTE — Progress Notes (Signed)
No charge progress note.  Emily Jones  is a 79 y.o. pleasant Caucasian female with a known history of type 2 diabetes mellitus, dyslipidemia and hypertension as well as GERD, presented to the emergency room with acute onset of midsternal chest pain felt the emesis [place] and moderate to severe in intensity with nausea without vomiting or diaphoresis or radiation.   EKG significant for STEMI.  She was taken for emergent cardiac catheterization by Dr. Humphrey Rolls and found to have stenosis of OM1 and mid left circumflex which were successfully stented.  Patient was chest pain-free when seen today.  Denies any other complaint. Exam was normal.  We will continue with current management and follow-up with cardiology recommendations. Should be able to go home if remains stable tomorrow.

## 2020-05-20 NOTE — H&P (Signed)
PATIENT NAME: Emily Jones    MR#:  [id number]  DATE OF BIRTH:  1941/08/02  DATE OF ADMISSION:  05/20/2020  PRIMARY CARE PHYSICIAN: Maryland Pink, MD   REQUESTING/REFERRING PHYSICIAN: Isaias Cowman, MD  CHIEF COMPLAINT:   Chief Complaint  Patient presents with  . Chest Pain    HISTORY OF PRESENT ILLNESS:  Emily Jones  is a 79 y.o. pleasant Caucasian female with a known history of type 2 diabetes mellitus, dyslipidemia and hypertension as well as GERD, presented to the emergency room with acute onset of midsternal chest pain felt the emesis [place] and moderate to severe in intensity with nausea without vomiting or diaphoresis or radiation.  She denies any cough or wheezing or hemoptysis.  No fever or chills.  No leg pain or edema recent travels or surgeries.  No bleeding diathesis.  No dysuria, oliguria or hematuria or flank pain.  Upon presentation to the ER blood pressure was 170/92 with otherwise normal vital signs.  It has been as high as 190/107.  Labs revealed borderline potassium of 5 hyperglycemia of 327 with otherwise unremarkable CMP.  Initial high-sensitivity troponin I was 60 and later 185.  CBC was within normal.  Respiratory panel including COVID-19 PCR, influenza antigens came back negative.  Two-view chest x-ray showed no acute cardiopulmonary disease. Initial EKG showed normal sinus rhythm with rate of 73 with about half to 1 mm ST elevation in V5 and V6 with flattening T waves in aVLRepeat EKG after midnight showed sinus rhythm with rate of 91 with more significant lateral ST segment elevation in V4, V5 and V6 with some inferior ST segment elevation as well.  Code STEMI was called and Dr. Saralyn Pilar was notified about the patient.  The patient was taken to the Cath Lab and underwent PCI with multiple stents including 1 in the OM1, and another 1 in the mid LMCA and a third 1 in the proximal LCA.  The patient was placed on low rate Angiomax will be  continued for a few hours and later he will be placed on aspirin and Brilinta.  He was initially started on IV heparin in the ER.Marland Kitchen  She was also placed on high-dose Lipitor.  She is admitted post-cath to the ICU for further management PAST MEDICAL HISTORY:   Past Medical History:  Diagnosis Date  . Allergic state   . Costochondritis   . Depression   . Diabetes mellitus without complication ([place])   . Esophagitis   . GERD (gastroesophageal reflux disease)   . Headache   . Hemorrhoids   . Hyperlipidemia   . Hypertension   . Panic attacks     PAST SURGICAL HISTORY:  Appendectomy PCA and LCA stents  SOCIAL HISTORY:   Social History   Tobacco Use  . Smoking status: Never Smoker  . Smokeless tobacco: Never Used  Substance Use Topics  . Alcohol use: No    FAMILY HISTORY:  No family history on file.  No pertinent familial diseases  DRUG ALLERGIES:   Allergies  Allergen Reactions  . Aspirin   . Chlorpheniramine Maleate   . Codeine   . Statins Other (See Comments)    Muscle Spasms  . Sulfa Antibiotics   . Penicillins Rash    REVIEW OF SYSTEMS:   ROS As per history of present illness. All pertinent systems were reviewed above. Constitutional, HEENT, cardiovascular, respiratory, GI, GU, musculoskeletal, neuro, psychiatric, endocrine, integumentary and hematologic systems were reviewed and are  otherwise negative/unremarkable except for positive findings mentioned above in the HPI.   MEDICATIONS AT HOME:   Prior to Admission medications   Medication Sig Start Date End Date Taking? Authorizing Provider  acetaminophen (TYLENOL) 325 MG tablet Take by mouth every 6 (six) hours as needed.    [provider]  clonazePAM (KLONOPIN) 0.5 MG tablet Take 0.25 mg by mouth daily.    [provider]  meclizine (ANTIVERT) 25 MG tablet Take 25 mg by mouth 3 (three) times daily as needed for dizziness.    [provider]  mupirocin ointment (BACTROBAN) 2 %  Place 1 application into the nose 2 (two) times daily.    [provider]  omega-3 acid ethyl esters (LOVAZA) 1 g capsule Take 1 g by mouth daily.    [provider]  omeprazole (PRILOSEC) 20 MG capsule Take 20 mg by mouth daily.    [provider]  sertraline (ZOLOFT) 100 MG tablet Take 100 mg by mouth daily.    [provider]  VITAMIN C, CALCIUM ASCORBATE, PO Take 1 tablet by mouth daily.    [provider]      VITAL SIGNS:  Blood pressure (!) 149/81, pulse 80, temperature 98.5 F (36.9 C), temperature source Oral, resp. rate (!) 8, height 5\' 5"  (1.651 m), weight 68 kg, SpO2 99 %.  PHYSICAL EXAMINATION:  Physical Exam  GENERAL:  79 y.o.-year-old Caucasian female patient lying in the bed with no acute distress.  EYES: Pupils equal, round, reactive to light and accommodation. No scleral icterus. Extraocular muscles intact.  HEENT: Head atraumatic, normocephalic. Oropharynx and nasopharynx clear.  NECK:  Supple, no jugular venous distention. No thyroid enlargement, no tenderness.  LUNGS: Normal breath sounds bilaterally, no wheezing, rales,rhonchi or crepitation. No use of accessory muscles of respiration.  CARDIOVASCULAR: Regular rate and rhythm, S1, S2 normal. No murmurs, rubs, or gallops.  ABDOMEN: Soft, nondistended, nontender. Bowel sounds present. No organomegaly or mass.  EXTREMITIES: No pedal edema, cyanosis, or clubbing.  NEUROLOGIC: Cranial nerves II through XII are intact. Muscle strength 5/5 in all extremities. Sensation intact. Gait not checked.  PSYCHIATRIC: The patient is alert and oriented x 3.  Normal affect and good eye contact. SKIN: No obvious rash, lesion, or ulcer.   LABORATORY PANEL:   CBC Recent Labs  Lab 05/19/20 2007  WBC 6.2  HGB 12.9  HCT 37.9  PLT 181   ------------------------------------------------------------------------------------------------------------------  Chemistries  Recent Labs  Lab  05/19/20 2007  NA 134*  K 5.0  CL 97*  CO2 23  GLUCOSE 327*  BUN 19  CREATININE 0.97  CALCIUM 9.3  AST 19  ALT 17  ALKPHOS 119  BILITOT 1.1   ------------------------------------------------------------------------------------------------------------------  Cardiac Enzymes No results for input(s): TROPONINI in the last 168 hours. ------------------------------------------------------------------------------------------------------------------  RADIOLOGY:  DG Chest 2 View  Result Date: 05/19/2020 CLINICAL DATA:  Central chest pain for several hours EXAM: CHEST - 2 VIEW COMPARISON:  None. FINDINGS: Cardiac shadow is within normal limits. Aortic calcifications are seen. Lungs are well aerated bilaterally. No acute bony abnormality is seen. IMPRESSION: No acute abnormality noted. Electronically Signed   By: Inez Catalina M.D.   On: 05/19/2020 20:32   CARDIAC CATHETERIZATION  Result Date: 05/20/2020  1st Mrg lesion is 100% stenosed.  Mid Cx lesion is 95% stenosed.  Ost Cx to Prox Cx lesion is 90% stenosed.  Mid LAD lesion is 50% stenosed.  Mid LAD to Dist LAD lesion is 40% stenosed.  Prox RCA  lesion is 30% stenosed.  Dist RCA lesion is 40% stenosed.  RPAV lesion is 50% stenosed.  A drug-eluting stent was successfully placed using a STENT RESOLUTE ONYX 2.0X15.  Post intervention, there is a 0% residual stenosis.  A drug-eluting stent was successfully placed using a [place] [id number].  Post intervention, there is a 0% residual stenosis.  A drug-eluting stent was successfully placed using a [place] [id number].  Post intervention, there is a 0% residual stenosis.  1.  Inferior STEMI 2.  Acute occlusion of OM1 with high-grade 95% stenosis mid left circumflex, and ulcerated 90% stenosis proximal left circumflex 3.  Mildly reduced left ventricular function, with estimated LVEF 45 to 50%, with inferoapical hypokinesis 4.  Successful primary PCI with DES OM1, mid left  circumflex, and proximal left circumflex Recommendations 1.  Dual antiplatelet therapy uninterrupted for 1 year 2.  Start high intensity atorvastatin 3.  Start metoprolol tartrate 25 mg twice daily 4.  2D echocardiogram      IMPRESSION AND PLAN:  1.  Inferolateral STEMI, status post PCI and 3 stents, and OM1, mid LCA and proximal. -The patient is admitted to an ICU bed. -She will be continued on aspirin and Brilinta. -We will continue high-dose statin therapy. -Pain management will be provided with sublingual nitroglycerin and morphine sulfate. -Serial troponins are followed. -Echocardiogram was ordered. -Cardiology consult will be obtained. -I notified Dr. Humphrey Rolls about the patient  2.  Type II diabetes mellitus. -We will place the patient on supplemental coverage with NovoLog. -Metformin will be held off.  3.  Anxiety and depression. -Klonopin and Zoloft will be continued.  4.  GERD. -We will continue PPI therapy.  5.  DVT prophylaxis. -Subcutaneous Lovenox.   All the records are reviewed and case discussed with ED provider. The plan of care was discussed in details with the patient (and family). I answered all questions. The patient agreed to proceed with the above mentioned plan. Further management will depend upon hospital course.   CODE STATUS: Full code  Status is: Inpatient  Remains inpatient appropriate because:Ongoing active pain requiring inpatient pain management, Ongoing diagnostic testing needed not appropriate for outpatient work up, Unsafe d/c plan, IV treatments appropriate due to intensity of illness or inability to take PO and Inpatient level of care appropriate due to severity of illness   Dispo: The patient is from: Home              Anticipated d/c is to: Home              Anticipated d/c date is: 2 days              Patient currently is not medically stable to d/c.   TOTAL TIME TAKING CARE OF THIS PATIENT: 55 minutes.    Christel Mormon M.D on  05/20/2020 at 6:05 AM  [facility]   From 7 PM-7 AM, contact night-coverage [email or web address]  CC: Primary care physician; Maryland Pink, MD

## 2020-05-20 NOTE — Progress Notes (Signed)
ANTICOAGULATION CONSULT NOTE - Initial Consult  Pharmacy Consult for  Heparin  Indication: chest pain/ACS  Allergies  Allergen Reactions  . Aspirin   . Chlorpheniramine Maleate   . Codeine   . Statins Other (See Comments)    Muscle Spasms  . Sulfa Antibiotics   . Penicillins Rash    Patient Measurements: Height: 5\' 5"  (165.1 cm) Weight: 68 kg (150 lb) IBW/kg (Calculated) : 57 Heparin Dosing Weight: 68 kg  Vital Signs: Temp: 98.4 F (36.9 C) (09/23 2158) Temp Source: Oral (09/23 2158) BP: 180/104 (09/24 0030) Pulse Rate: 90 (09/24 0030)  Labs: Recent Labs    05/19/20 2007 05/19/20 2210  HGB 12.9  --   HCT 37.9  --   PLT 181  --   CREATININE 0.97  --   TROPONINIHS 60* 185*    Estimated Creatinine Clearance: 43 mL/min (by C-G formula based on SCr of 0.97 mg/dL).   Medical History: Past Medical History:  Diagnosis Date  . Allergic state   . Costochondritis   . Depression   . Diabetes mellitus without complication ([place])   . Esophagitis   . GERD (gastroesophageal reflux disease)   . Headache   . Hemorrhoids   . Hyperlipidemia   . Hypertension   . Panic attacks     Medications:  Scheduled:    Assessment: Pharmacy consulted to dose heparin in this 79 year old female admitted with ACS/NSTEMI.  CrCl = 43 ml/min No prior anticoag noted.  Heparin 4000 units IV X 1 given prior to collection of INR/aPTT.  Will not order baseline labs on these.   Goal of Therapy:  Heparin level 0.3-0.7 units/ml Monitor platelets by anticoagulation protocol: Yes   Plan:  Start heparin infusion at 800 units/hr Check anti-Xa level in 8 hours and daily while on heparin Continue to monitor H&H and platelets  Adeyemi Hamad D 05/20/2020,12:56 AM

## 2020-05-21 DIAGNOSIS — I213 ST elevation (STEMI) myocardial infarction of unspecified site: Secondary | ICD-10-CM

## 2020-05-21 LAB — GLUCOSE, CAPILLARY
Glucose-Capillary: 118 mg/dL — ABNORMAL HIGH (ref 70–99)
Glucose-Capillary: 139 mg/dL — ABNORMAL HIGH (ref 70–99)
Glucose-Capillary: 169 mg/dL — ABNORMAL HIGH (ref 70–99)
Glucose-Capillary: 155 mg/dL — ABNORMAL HIGH (ref 70–99)

## 2020-05-21 LAB — ECHOCARDIOGRAM COMPLETE
AR max vel: 2.34 cm2
AV Peak grad: 4.2 mmHg
Ao pk vel: 1.02 m/s
Area-P 1/2: 5.42 cm2
S' Lateral: 3.25 cm

## 2020-05-21 NOTE — Progress Notes (Signed)
SUBJECTIVE: Patient denies any chest pain or shortness of breath   Vitals:   05/21/20 0900 05/21/20 0952 05/21/20 1050 05/21/20 1152  BP: (!) 84/65 (!) 125/59 109/68 116/61  Pulse:  73 78 67  Resp: 16 19 19 12   Temp:  98.6 F (37 C) 98 F (36.7 C) 99.2 F (37.3 C)  TempSrc:  Oral Oral Oral  SpO2:  98% 100% 99%  Weight:   57.1 kg   Height:   5\' 5"  (1.651 m)     Intake/Output Summary (Last 24 hours) at 05/21/2020 1437 Last data filed at 05/21/2020 1350 Gross per 24 hour  Intake 708.87 ml  Output 500 ml  Net 208.87 ml    LABS: Basic Metabolic Panel: Recent Labs    05/19/20 2007 05/20/20 0540  NA 134* 130*  K 5.0 4.5  CL 97* 96*  CO2 23 23  GLUCOSE 327* 202*  BUN 19 17  CREATININE 0.97 0.82  CALCIUM 9.3 8.8*   Liver Function Tests: Recent Labs    05/19/20 2007  AST 19  ALT 17  ALKPHOS 119  BILITOT 1.1  PROT 7.8  ALBUMIN 4.5   No results for input(s): LIPASE, AMYLASE in the last 72 hours. CBC: Recent Labs    05/19/20 2007 05/20/20 0540  WBC 6.2 7.5  HGB 12.9 12.0  HCT 37.9 34.6*  MCV 80.0 79.0*  PLT 181 173   Cardiac Enzymes: No results for input(s): CKTOTAL, CKMB, CKMBINDEX, TROPONINI in the last 72 hours. BNP: Invalid input(s): POCBNP D-Dimer: No results for input(s): DDIMER in the last 72 hours. Hemoglobin A1C: Recent Labs    05/20/20 0540  HGBA1C 8.3*   Fasting Lipid Panel: No results for input(s): CHOL, HDL, LDLCALC, TRIG, CHOLHDL, LDLDIRECT in the last 72 hours. Thyroid Function Tests: No results for input(s): TSH, T4TOTAL, T3FREE, THYROIDAB in the last 72 hours.  Invalid input(s): FREET3 Anemia Panel: No results for input(s): VITAMINB12, FOLATE, FERRITIN, TIBC, IRON, RETICCTPCT in the last 72 hours.   PHYSICAL EXAM General: Well developed, well nourished, in no acute distress HEENT:  Normocephalic and atramatic Neck:  No JVD.  Lungs: Clear bilaterally to auscultation and percussion. Heart: HRRR . Normal S1 and S2 without  gallops or murmurs.  Abdomen: Bowel sounds are positive, abdomen soft and non-tender  Msk:  Back normal, normal gait. Normal strength and tone for age. Extremities: No clubbing, cyanosis or edema.   Neuro: Alert and oriented X 3. Psych:  Good affect, responds appropriately  TELEMETRY: Sinus rhythm  ASSESSMENT AND PLAN: Inferolateral STEMI status post PCI of OM and mid left circumflex.  Patient appears to be comfortable and denies any chest pain or shortness of breath.  Patient will be moved to telemetry.  Patient can be discharged in a day or so with follow-up 2 weeks with Dr. Raliegh Scarlet.  Active Problems:   STEMI involving oth coronary artery of inferior wall (HCC)   STEMI (ST elevation myocardial infarction) ([place])    Dionisio David, MD, [facility] 05/21/2020 2:37 PM

## 2020-05-21 NOTE — Progress Notes (Signed)
Physical Therapy Evaluation Patient Details Name: Emily Jones MRN: [id number] DOB: Jun 26, 1941 Today's Date: 05/21/2020   History of Present Illness  Per MD note:FredaKimreyis I30 y.o.pleasant Caucasian femalewith a known history of type 2 diabetes mellitus, dyslipidemia and hypertension as well as GERD, presented to the emergency room withacute onset of midsternal chest pain felt the emesis Packers and moderate to severe in intensity with nausea without vomiting or diaphoresis or radiation.  Clinical Impression  Patient agrees to PT eval. She has 3/5 BLE hip flex and knee extension strength. She has good sitting balance and fair standing balance with UE support with RW.  She is MI with bed mobility, min assist for transfers sit to stand with RW. She ambulates with RW 20 feet with min assist. She has fatigue following gait. Patient will continue to benefit from skilled PT to improve mobility and strength.    Follow Up Recommendations Home health PT    Equipment Recommendations  Rolling walker with 5" wheels;3in1 (PT);Other (comment)    Recommendations for Other Services       Precautions / Restrictions Restrictions Weight Bearing Restrictions: No      Mobility  Bed Mobility Overal bed mobility: Modified Independent                Transfers Overall transfer level: Modified independent Equipment used: Rolling walker (2 wheeled)             General transfer comment: vC for hand placement  Ambulation/Gait Ambulation/Gait assistance: Min guard Gait Distance (Feet): 20 Feet Assistive device: Rolling walker (2 wheeled) Gait Pattern/deviations: Step-to pattern Gait velocity:  (decreased)      Stairs            Wheelchair Mobility    Modified Rankin (Stroke Patients Only)       Balance Overall balance assessment: Needs assistance Sitting-balance support: No upper extremity supported;Feet supported Sitting balance-Leahy Scale: Good      Standing balance support: Bilateral upper extremity supported Standing balance-Leahy Scale: Fair                               Pertinent Vitals/Pain Pain Assessment: No/denies pain    Home Living Family/patient expects to be discharged to:: Private residence Living Arrangements: Other relatives Available Help at Discharge: Family Type of Home: House Home Access: Stairs to enter   Technical brewer of Steps: 2          Prior Function Level of Independence: Independent               Hand Dominance        Extremity/Trunk Assessment   Upper Extremity Assessment Upper Extremity Assessment: Generalized weakness    Lower Extremity Assessment Lower Extremity Assessment: Generalized weakness       Communication   Communication: No difficulties  Cognition Arousal/Alertness: Awake/alert Behavior During Therapy: Restless Overall Cognitive Status: Within Functional Limits for tasks assessed                                        General Comments      Exercises     Assessment/Plan    PT Assessment Patient needs continued PT services  PT Problem List Decreased strength;Decreased balance;Decreased mobility       PT Treatment Interventions Gait training;Therapeutic activities;Therapeutic exercise;Balance training    PT Goals (Current goals can  be found in the Care Plan section)  Acute Rehab PT Goals Patient Stated Goal:  (to walk) PT Goal Formulation: With patient Time For Goal Achievement: 06/04/20 Potential to Achieve Goals: Fair    Frequency Min 2X/week   Barriers to discharge        Co-evaluation               AM-PAC PT "6 Clicks" Mobility  Outcome Measure Help needed turning from your back to your side while in a flat bed without using bedrails?: None Help needed moving from lying on your back to sitting on the side of a flat bed without using bedrails?: A Little Help needed moving to and from a bed to  a chair (including a wheelchair)?: A Little Help needed standing up from a chair using your arms (e.g., wheelchair or bedside chair)?: A Little Help needed to walk in hospital room?: A Little Help needed climbing 3-5 steps with a railing? : A Little 6 Click Score: 19    End of Session Equipment Utilized During Treatment: Gait belt Activity Tolerance: Patient tolerated treatment well Patient left: in bed;with bed alarm set Nurse Communication: Mobility status PT Visit Diagnosis: Unsteadiness on feet (R26.81);Muscle weakness (generalized) (M62.81);Difficulty in walking, not elsewhere classified (R26.2)    Time: 1415-1440 PT Time Calculation (min) (ACUTE ONLY): 25 min   Charges:   PT Evaluation $PT Eval Low Complexity: 1 Low PT Treatments $Gait Training: 8-22 mins $Therapeutic Activity: 8-22 mins          Alanson Puls, PT DPT 05/21/2020, 3:50 PM

## 2020-05-21 NOTE — Progress Notes (Signed)
Patient arrived to room from ICU. Helped patient to bathroom. Has diarrhea. Noted patient unsteady on feet and discussed future movements with walker. This RN contacted case manager about getting walker for home use for patient. She confirmed she will acquire for patient. NT Mykia currently assisting patient with admission needs at this time. My assessment completed at bedside after patient arrived to room.

## 2020-05-21 NOTE — TOC Progression Note (Signed)
Transition of Care [facility]) - Progression Note    Patient Details  Name: Emily Jones MRN: [id number] Date of Birth: 1941/03/05  Transition of Care [facility]) CM/SW Contact  Truitt Merle, [place] Phone Number: 05/21/2020, 11:23 AM  Clinical Narrative:    LCSW received message from RN Rose Phi requesting a walker. LCSW spoke with Jermaine at [facility], to confirm delivery of walker for home use to the room for patient's discharge.     Oretha Ellis, LCSW Transitions of Care Dept.  [phone]        Expected Discharge Plan and Services                                                 Social Determinants of Health (SDOH) Interventions    Readmission Risk Interventions No flowsheet data found.

## 2020-05-21 NOTE — Progress Notes (Signed)
Updated patient's daughter Almyra Free that patient transferred to room 232.

## 2020-05-21 NOTE — Progress Notes (Signed)
PROGRESS NOTE    MARRISSA Jones  RAQ:[id number] DOB: 10/13/1940 DOA: 05/20/2020 PCP: Maryland Pink, MD   Brief Narrative:  Emily Jones y.o.pleasant Caucasian femalewith a known history of type 2 diabetes mellitus, dyslipidemia and hypertension as well as GERD, presented to the emergency room withacute onset of midsternal chest pain felt the emesis Packers and moderate to severe in intensity with nausea without vomiting or diaphoresis or radiation.   EKG significant for STEMI.  She was taken for emergent cardiac catheterization by Dr. Humphrey Rolls and found to have stenosis of OM1 and mid left circumflex which were successfully stented.  Subjective: Patient denies any more chest pain or shortness of breath today but continued to have some nausea and was not feeling well overall. We discussed about allergies noted with aspirin and statin.  Patient did received a dose of statin here without any significant reaction.  She does not remember what happened with aspirin but wants to give it a try.  Assessment & Plan:   Active Problems:   STEMI involving oth coronary artery of inferior wall (HCC)   STEMI (ST elevation myocardial infarction) ([place])  STEMI involving the inferior wall.  S/p cardiac catheterization and successful PCI.  Patient remained chest pain-free.  But continued to have some nausea and overall not feeling well. -We will obtain PT evaluation for home health needs. -Continue with Zofran as needed. -DAPT with aspirin and Brilinta for 1 year. -Follow-up with cardiac rehab. -Continue with beta-blocker and lisinopril.  Type II diabetes mellitus.  On Metformin at home. -Continue with SSI  Anxiety and depression.  No acute concern. -Continue with home dose of Zoloft and Klonopin.  GERD. -Continue with PPI.  Objective: Vitals:   05/21/20 0900 05/21/20 0952 05/21/20 1050 05/21/20 1152  BP: (!) 84/65 (!) 125/59 109/68 116/61  Pulse:  73 78 67  Resp: 16 19 19 12   Temp:   98.6 F (37 C) 98 F (36.7 C) 99.2 F (37.3 C)  TempSrc:  Oral Oral Oral  SpO2:  98% 100% 99%  Weight:   57.1 kg   Height:   5\' 5"  (1.651 m)     Intake/Output Summary (Last 24 hours) at 05/21/2020 1521 Last data filed at 05/21/2020 1350 Gross per 24 hour  Intake 708.87 ml  Output 500 ml  Net 208.87 ml   Filed Weights   05/19/20 1953 05/21/20 1050  Weight: 68 kg 57.1 kg    Examination:  General exam: Pleasant elderly lady, appears calm and comfortable  Respiratory system: Clear to auscultation. Respiratory effort normal. Cardiovascular system: S1 & S2 heard, RRR. No JVD, murmurs, Gastrointestinal system: Soft, nontender, nondistended, bowel sounds positive. Central nervous system: Alert and oriented. No focal neurological deficits. Extremities: No edema, no cyanosis, pulses intact and symmetrical. Psychiatry: Judgement and insight appear normal.     DVT prophylaxis: Lovenox Code Status: Full Family Communication: Daughter was updated. Disposition Plan:  Status is: Inpatient  Remains inpatient appropriate because:Inpatient level of care appropriate due to severity of illness   Dispo: The patient is from: Home              Anticipated d/c is to: Home              Anticipated d/c date is: 1 day              Patient currently is not medically stable to d/c.  Consultants:   Cardiology  Procedures:  Antimicrobials:   Data Reviewed: I have personally reviewed following  labs and imaging studies  CBC: Recent Labs  Lab 05/19/20 2007 05/20/20 0540  WBC 6.2 7.5  HGB 12.9 12.0  HCT 37.9 34.6*  MCV 80.0 79.0*  PLT 181 326   Basic Metabolic Panel: Recent Labs  Lab 05/19/20 2007 05/20/20 0540  NA 134* 130*  K 5.0 4.5  CL 97* 96*  CO2 23 23  GLUCOSE 327* 202*  BUN 19 17  CREATININE 0.97 0.82  CALCIUM 9.3 8.8*   GFR: Estimated Creatinine Clearance: 50.9 mL/min (by C-G formula based on SCr of 0.82 mg/dL). Liver Function Tests: Recent Labs  Lab  05/19/20 2007  AST 19  ALT 17  ALKPHOS 119  BILITOT 1.1  PROT 7.8  ALBUMIN 4.5   No results for input(s): LIPASE, AMYLASE in the last 168 hours. No results for input(s): AMMONIA in the last 168 hours. Coagulation Profile: No results for input(s): INR, PROTIME in the last 168 hours. Cardiac Enzymes: No results for input(s): CKTOTAL, CKMB, CKMBINDEX, TROPONINI in the last 168 hours. BNP (last 3 results) No results for input(s): PROBNP in the last 8760 hours. HbA1C: Recent Labs    05/20/20 0540  HGBA1C 8.3*   CBG: Recent Labs  Lab 05/20/20 1149 05/20/20 1615 05/20/20 2047 05/21/20 0721 05/21/20 1153  GLUCAP 197* 163* 169* 118* 139*   Lipid Profile: No results for input(s): CHOL, HDL, LDLCALC, TRIG, CHOLHDL, LDLDIRECT in the last 72 hours. Thyroid Function Tests: No results for input(s): TSH, T4TOTAL, FREET4, T3FREE, THYROIDAB in the last 72 hours. Anemia Panel: No results for input(s): VITAMINB12, FOLATE, FERRITIN, TIBC, IRON, RETICCTPCT in the last 72 hours. Sepsis Labs: No results for input(s): PROCALCITON, LATICACIDVEN in the last 168 hours.  Recent Results (from the past 240 hour(s))  Respiratory Panel by RT PCR (Flu A&B, Covid) - Nasopharyngeal Swab     Status: None   Collection Time: 05/20/20 12:49 AM   Specimen: Nasopharyngeal Swab  Result Value Ref Range Status   SARS Coronavirus 2 by RT PCR NEGATIVE NEGATIVE Final    Comment: (NOTE) SARS-CoV-2 target nucleic acids are NOT DETECTED.  The SARS-CoV-2 RNA is generally detectable in upper respiratoy specimens during the acute phase of infection. The lowest concentration of SARS-CoV-2 viral copies this assay can detect is 131 copies/mL. A negative result does not preclude SARS-Cov-2 infection and should not be used as the sole basis for treatment or other patient management decisions. A negative result may occur with  improper specimen collection/handling, submission of specimen other than nasopharyngeal  swab, presence of viral mutation(s) within the areas targeted by this assay, and inadequate number of viral copies (<131 copies/mL). A negative result must be combined with clinical observations, patient history, and epidemiological information. The expected result is Negative.  Fact Sheet for Patients:  Emily Jones.be  Fact Sheet for Healthcare Providers:  GravelBags.it  This test is no t yet approved or cleared by the [place] FDA and  has been authorized for detection and/or diagnosis of SARS-CoV-2 by FDA under an Emergency Use Authorization (EUA). This EUA will remain  in effect (meaning this test can be used) for the duration of the COVID-19 declaration under Section 564(b)(1) of the Act, 21 U.S.C. section 360bbb-3(b)(1), unless the authorization is terminated or revoked sooner.     Influenza A by PCR NEGATIVE NEGATIVE Final   Influenza B by PCR NEGATIVE NEGATIVE Final    Comment: (NOTE) The Xpert Xpress SARS-CoV-2/FLU/RSV assay is intended as an aid in  the diagnosis of influenza from Nasopharyngeal swab specimens  and  should not be used as a sole basis for treatment. Nasal washings and  aspirates are unacceptable for Xpert Xpress SARS-CoV-2/FLU/RSV  testing.  Fact Sheet for Patients: Emily Jones.be  Fact Sheet for Healthcare Providers: GravelBags.it  This test is not yet approved or cleared by the [place] FDA and  has been authorized for detection and/or diagnosis of SARS-CoV-2 by  FDA under an Emergency Use Authorization (EUA). This EUA will remain  in effect (meaning this test can be used) for the duration of the  Covid-19 declaration under Section 564(b)(1) of the Act, 21  U.S.C. section 360bbb-3(b)(1), unless the authorization is  terminated or revoked. Performed at [facility], [address] [place], [place] [address]   MRSA  PCR Screening     Status: None   Collection Time: 05/20/20  3:30 AM   Specimen: Nasopharyngeal  Result Value Ref Range Status   MRSA by PCR NEGATIVE NEGATIVE Final    Comment:        The GeneXpert MRSA Assay (FDA approved for NASAL specimens only), is one component of a comprehensive MRSA colonization surveillance program. It is not intended to diagnose MRSA infection nor to guide or monitor treatment for MRSA infections. Performed at [facility], [address], [place], [place] [address]      Radiology Studies: DG Chest 2 View  Result Date: 05/19/2020 CLINICAL DATA:  Central chest pain for several hours EXAM: CHEST - 2 VIEW COMPARISON:  None. FINDINGS: Cardiac shadow is within normal limits. Aortic calcifications are seen. Lungs are well aerated bilaterally. No acute bony abnormality is seen. IMPRESSION: No acute abnormality noted. Electronically Signed   By: Inez Catalina M.D.   On: 05/19/2020 20:32   CARDIAC CATHETERIZATION  Result Date: 05/20/2020  1st Mrg lesion is 100% stenosed.  Mid Cx lesion is 95% stenosed.  Ost Cx to Prox Cx lesion is 90% stenosed.  Mid LAD lesion is 50% stenosed.  Mid LAD to Dist LAD lesion is 40% stenosed.  Prox RCA lesion is 30% stenosed.  Dist RCA lesion is 40% stenosed.  RPAV lesion is 50% stenosed.  A drug-eluting stent was successfully placed using a STENT RESOLUTE [id number].  Post intervention, there is a 0% residual stenosis.  A drug-eluting stent was successfully placed using a [place] [id number].  Post intervention, there is a 0% residual stenosis.  A drug-eluting stent was successfully placed using a [place] [id number].  Post intervention, there is a 0% residual stenosis.  1.  Inferior STEMI 2.  Acute occlusion of OM1 with high-grade 95% stenosis mid left circumflex, and ulcerated 90% stenosis proximal left circumflex 3.  Mildly reduced left ventricular function, with estimated LVEF 45 to 50%, with  inferoapical hypokinesis 4.  Successful primary PCI with DES OM1, mid left circumflex, and proximal left circumflex Recommendations 1.  Dual antiplatelet therapy uninterrupted for 1 year 2.  Start high intensity atorvastatin 3.  Start metoprolol tartrate 25 mg twice daily 4.  2D echocardiogram    Scheduled Meds: . aspirin  81 mg Oral Daily  . atorvastatin  80 mg Oral Daily  . insulin aspart  0-9 Units Subcutaneous TID PC & HS  . lisinopril  2.5 mg Oral Daily  . metoprolol tartrate  25 mg Oral BID  . sodium chloride flush  3 mL Intravenous Q12H  . ticagrelor  90 mg Oral BID   Continuous Infusions: . sodium chloride       LOS: 1 day  Time spent: 35 minutes.  Lorella Nimrod, MD [facility]  If 7PM-7AM, please contact night-coverage [email or web address]  05/21/2020, 3:21 PM   This record has been created using Systems analyst. Errors have been sought and corrected,but may not always be located. Such creation errors do not reflect on the standard of care.

## 2020-05-22 DIAGNOSIS — I2119 ST elevation (STEMI) myocardial infarction involving other coronary artery of inferior wall: Principal | ICD-10-CM

## 2020-05-22 LAB — BASIC METABOLIC PANEL
Anion gap: 9 (ref 5–15)
BUN: 24 mg/dL — ABNORMAL HIGH (ref 8–23)
CO2: 23 mmol/L (ref 22–32)
Calcium: 8.4 mg/dL — ABNORMAL LOW (ref 8.9–10.3)
Chloride: 101 mmol/L (ref 98–111)
Creatinine, Ser: 1.1 mg/dL — ABNORMAL HIGH (ref 0.44–1.00)
GFR calc Af Amer: 56 mL/min — ABNORMAL LOW (ref >60)
GFR calc non Af Amer: 48 mL/min — ABNORMAL LOW (ref >60)
Glucose, Bld: 174 mg/dL — ABNORMAL HIGH (ref 70–99)
Potassium: 4 mmol/L (ref 3.5–5.1)
Sodium: 133 mmol/L — ABNORMAL LOW (ref 135–145)

## 2020-05-22 LAB — CBC
HCT: 30.4 % — ABNORMAL LOW (ref 36.0–46.0)
Hemoglobin: 10.2 g/dL — ABNORMAL LOW (ref 12.0–15.0)
MCH: 27.1 pg (ref 26.0–34.0)
MCHC: 33.6 g/dL (ref 30.0–36.0)
MCV: 80.9 fL (ref 80.0–100.0)
Platelets: 160 10*3/uL (ref 150–400)
RBC: 3.76 MIL/uL — ABNORMAL LOW (ref 3.87–5.11)
RDW: 15.5 % (ref 11.5–15.5)
WBC: 5.9 10*3/uL (ref 4.0–10.5)
nRBC: 0 % (ref 0.0–0.2)

## 2020-05-22 LAB — GLUCOSE, CAPILLARY
Glucose-Capillary: 145 mg/dL — ABNORMAL HIGH (ref 70–99)
Glucose-Capillary: 285 mg/dL — ABNORMAL HIGH (ref 70–99)

## 2020-05-22 MED ORDER — ATORVASTATIN CALCIUM 80 MG PO TABS
80.0000 mg | ORAL_TABLET | Freq: Every day | ORAL | 1 refills | Status: DC
Start: 2020-05-23 — End: 2021-03-13

## 2020-05-22 MED ORDER — ASPIRIN 81 MG PO CHEW
81.0000 mg | CHEWABLE_TABLET | Freq: Every day | ORAL | 0 refills | Status: DC
Start: 2020-05-23 — End: 2021-03-13

## 2020-05-22 MED ORDER — NITROGLYCERIN 0.4 MG SL SUBL: 0.4000 mg | SUBLINGUAL_TABLET | SUBLINGUAL | 12 refills | Status: DC | PRN

## 2020-05-22 MED ORDER — METOPROLOL TARTRATE 25 MG PO TABS
25.0000 mg | ORAL_TABLET | Freq: Two times a day (BID) | ORAL | 0 refills | Status: DC
Start: 2020-05-22 — End: 2021-03-13

## 2020-05-22 MED ORDER — LISINOPRIL 2.5 MG PO TABS
2.5000 mg | ORAL_TABLET | Freq: Every day | ORAL | 1 refills | Status: DC
Start: 2020-05-23 — End: 2021-03-13

## 2020-05-22 MED ORDER — TICAGRELOR 90 MG PO TABS
90.0000 mg | ORAL_TABLET | Freq: Two times a day (BID) | ORAL | 1 refills | Status: DC
Start: 2020-05-22 — End: 2021-03-13

## 2020-05-22 NOTE — Discharge Summary (Signed)
Physician Discharge Summary  Emily Jones LFY:[id number] DOB: 1941/01/01 DOA: 05/20/2020  PCP: Maryland Pink, MD  Admit date: 05/20/2020 Discharge date: 05/22/2020  Admitted From: Home Disposition: Home  Recommendations for Outpatient Follow-up:  1. Follow up with PCP in 1-2 weeks 2. Follow-up with cardiology 3. Please obtain BMP/CBC in one week 4. Please follow up on the following pending results: None  Home Health: Yes Equipment/Devices: Rolling walker, 3 in 1 Discharge Condition: Stable CODE STATUS: Full Diet recommendation: Heart Healthy / Carb Modified   Brief/Interim Summary: brittinee risk y.o.pleasant Caucasian femalewith a known history of type 2 diabetes mellitus, dyslipidemia and hypertension as well as GERD, presented to the emergency room withacute onset of midsternal chest pain associated with some nausea, no vomiting or diaphoresis. Known radiation.  EKG significant for STEMI.She was taken for emergent cardiac catheterization by Dr. Humphrey Rolls and found to have stenosis of OM1 and mid left circumflex which were successfully stented. Post cardiac catheterization echo with normal EF, grade 2 diastolic dysfunction and regional wall motion abnormalities.  Patient was started on DAPT with aspirin and Brilinta for his 1 year.  There is some history of allergies with aspirin.  When asked patient she does not remember anything but able to try 81 mg of aspirin without any reaction. She was also started on high-dose statin.  Statin allergies was also listed in her chart but she was able to tolerate without any difficulty.  She will follow-up with her cardiologist next week.  She will continue with rest of her home meds and follow-up with her primary care provider.   Discharge Diagnoses:  Active Problems:   STEMI involving oth coronary artery of inferior wall (HCC)   STEMI (ST elevation myocardial infarction) [facility])   Discharge Instructions  Discharge Instructions     AMB Referral to Cardiac Rehabilitation - Phase II   Complete by: As directed    Diagnosis:  STEMI Coronary Stents     After initial evaluation and assessments completed: Virtual Based Care may be provided alone or in conjunction with Phase 2 Cardiac Rehab based on patient barriers.: Yes   Diet - low sodium heart healthy   Complete by: As directed    Discharge instructions   Complete by: As directed    It was pleasure taking care of you. You are being started on some new medications due to your heart condition.  Please take it as directed and follow-up with your cardiologist for further recommendations. Continue with your home medications and follow-up with your primary care provider.   Discharge instructions   Complete by: As directed    It was pleasure taking care of you. You are being started on aspirin and Brilinta along with Lipitor.  Aspirin and Lipitor allergy was listed in your chart as we discussed you do not remember what happened and you did receive these medications while in the hospital without any significant reaction.  Please continue taking these indications and follow-up with your cardiologist in 1 week. You are also been started on lisinopril and metoprolol because of your recent heart attack.  Please take it as directed. Keep yourself well-hydrated.   Increase activity slowly   Complete by: As directed    Increase activity slowly   Complete by: As directed      Allergies as of 05/22/2020      Reactions   Aspirin    Chlorpheniramine Maleate    Codeine    Statins Other (See Comments)   Muscle Spasms  Sulfa Antibiotics    Penicillins Rash      Medication List    TAKE these medications   acetaminophen 325 MG tablet Commonly known as: TYLENOL Take by mouth every 6 (six) hours as needed.   aspirin 81 MG chewable tablet Chew 1 tablet (81 mg total) by mouth daily. Start taking on: May 23, 2020   atorvastatin 80 MG tablet Commonly known as:  LIPITOR Take 1 tablet (80 mg total) by mouth daily. Start taking on: May 23, 2020   clonazePAM 0.5 MG tablet Commonly known as: KLONOPIN Take 0.25 mg by mouth daily.   lisinopril 2.5 MG tablet Commonly known as: ZESTRIL Take 1 tablet (2.5 mg total) by mouth daily. Start taking on: May 23, 2020   meclizine 25 MG tablet Commonly known as: ANTIVERT Take 25 mg by mouth 3 (three) times daily as needed for dizziness.   metoprolol tartrate 25 MG tablet Commonly known as: LOPRESSOR Take 1 tablet (25 mg total) by mouth 2 (two) times daily.   mupirocin ointment 2 % Commonly known as: BACTROBAN Place 1 application into the nose 2 (two) times daily.   nitroGLYCERIN 0.4 MG SL tablet Commonly known as: NITROSTAT Place 1 tablet (0.4 mg total) under the tongue every 5 (five) minutes as needed for chest pain.   omega-3 acid ethyl esters 1 g capsule Commonly known as: LOVAZA Take 1 g by mouth daily.   omeprazole 20 MG capsule Commonly known as: PRILOSEC Take 20 mg by mouth daily.   sertraline 100 MG tablet Commonly known as: ZOLOFT Take 100 mg by mouth daily.   ticagrelor 90 MG Tabs tablet Commonly known as: BRILINTA Take 1 tablet (90 mg total) by mouth 2 (two) times daily.   VITAMIN C (CALCIUM ASCORBATE) PO Take 1 tablet by mouth daily.            Durable Medical Equipment  (From admission, onward)         Start     Ordered   05/22/20 0825  For home use only DME Walker rolling  Once       Question Answer Comment  Walker: With [place] Wheels   Patient needs a walker to treat with the following condition Generalized weakness      05/22/20 0824   05/22/20 0825  For home use only DME 3 n 1  Once        05/22/20 6767          Follow-up Information    Paraschos, Alexander, MD. Schedule an appointment as soon as possible for a visit in 1 week(s).   Specialty: Cardiology Contact information: [facility] [place]  [place] [address] [phone]        Maryland Pink, MD. Schedule an appointment as soon as possible for a visit.   Specialty: Family Medicine Contact information: [address] [place] [address] [phone]              Allergies  Allergen Reactions  . Aspirin   . Chlorpheniramine Maleate   . Codeine   . Statins Other (See Comments)    Muscle Spasms  . Sulfa Antibiotics   . Penicillins Rash    Consultations:  Cardiology  Procedures/Studies: DG Chest 2 View  Result Date: 05/19/2020 CLINICAL DATA:  Central chest pain for several hours EXAM: CHEST - 2 VIEW COMPARISON:  None. FINDINGS: Cardiac shadow is within normal limits. Aortic calcifications are seen. Lungs are well aerated  bilaterally. No acute bony abnormality is seen. IMPRESSION: No acute abnormality noted. Electronically Signed   By: Inez Catalina M.D.   On: 05/19/2020 20:32   CARDIAC CATHETERIZATION  Result Date: 05/20/2020  1st Mrg lesion is 100% stenosed.  Mid Cx lesion is 95% stenosed.  Ost Cx to Prox Cx lesion is 90% stenosed.  Mid LAD lesion is 50% stenosed.  Mid LAD to Dist LAD lesion is 40% stenosed.  Prox RCA lesion is 30% stenosed.  Dist RCA lesion is 40% stenosed.  RPAV lesion is 50% stenosed.  A drug-eluting stent was successfully placed using a STENT RESOLUTE ONYX 2.0X15.  Post intervention, there is a 0% residual stenosis.  A drug-eluting stent was successfully placed using a [place] 7.82N56.  Post intervention, there is a 0% residual stenosis.  A drug-eluting stent was successfully placed using a [place] [id number].  Post intervention, there is a 0% residual stenosis.  1.  Inferior STEMI 2.  Acute occlusion of OM1 with high-grade 95% stenosis mid left circumflex, and ulcerated 90% stenosis proximal left circumflex 3.  Mildly reduced left ventricular function, with estimated LVEF 45 to 50%, with inferoapical hypokinesis 4.  Successful primary PCI with  DES OM1, mid left circumflex, and proximal left circumflex Recommendations 1.  Dual antiplatelet therapy uninterrupted for 1 year 2.  Start high intensity atorvastatin 3.  Start metoprolol tartrate 25 mg twice daily 4.  2D echocardiogram   ECHOCARDIOGRAM COMPLETE  Result Date: 05/21/2020    ECHOCARDIOGRAM REPORT   Patient Name:   Emily Jones Date of Exam: 05/20/2020 Medical Rec #:  [id number]      Height:       65.0 in Accession #:    [id number]     Weight:       150.0 lb Date of Birth:  10/12/1940       BSA:          1.750 m Patient Age:    79 years       BP:           149/81 mmHg Patient Gender: F              HR:           80 bpm. Exam Location:  [facility] Procedure: 2D Echo, Cardiac Doppler and Color Doppler Indications:     Acute myocardial infarction 410  History:         Patient has no prior history of Echocardiogram examinations.                  Risk Factors:Hypertension.  Sonographer:     Alyse Low Roar Referring Phys:  [place] Diagnosing Phys: Neoma Laming MD IMPRESSIONS  1. Left ventricular ejection fraction, by estimation, is 50 to 55%. The left ventricle has low normal function. The left ventricle demonstrates regional wall motion abnormalities (see scoring diagram/findings for description). Left ventricular diastolic  parameters are consistent with Grade II diastolic dysfunction (pseudonormalization).  2. Right ventricular systolic function is normal. The right ventricular size is normal.  3. Left atrial size was mildly dilated.  4. Right atrial size was mildly dilated.  5. The mitral valve is normal in structure. Moderate mitral valve regurgitation. No evidence of mitral stenosis.  6. The aortic valve is calcified. Aortic valve regurgitation is trivial. Mild to moderate aortic valve sclerosis/calcification is present, without any evidence of aortic stenosis.  7. The inferior vena cava is normal in size with greater than 50%  respiratory variability, suggesting right atrial pressure of  3 mmHg. FINDINGS  Left Ventricle: Left ventricular ejection fraction, by estimation, is 50 to 55%. The left ventricle has low normal function. The left ventricle demonstrates regional wall motion abnormalities. The left ventricular internal cavity size was normal in size. There is no concentric left ventricular hypertrophy. Left ventricular diastolic parameters are consistent with Grade II diastolic dysfunction (pseudonormalization).  LV Wall Scoring: The basal anteroseptal segment and basal inferoseptal segment are hypokinetic. Right Ventricle: The right ventricular size is normal. No increase in right ventricular wall thickness. Right ventricular systolic function is normal. Left Atrium: Left atrial size was mildly dilated. Right Atrium: Right atrial size was mildly dilated. Pericardium: There is no evidence of pericardial effusion. Mitral Valve: The mitral valve is normal in structure. Moderate mitral valve regurgitation. No evidence of mitral valve stenosis. Tricuspid Valve: The tricuspid valve is normal in structure. Tricuspid valve regurgitation is mild . No evidence of tricuspid stenosis. Aortic Valve: The aortic valve is calcified. Aortic valve regurgitation is trivial. Mild to moderate aortic valve sclerosis/calcification is present, without any evidence of aortic stenosis. Aortic valve peak gradient measures 4.2 mmHg. Pulmonic Valve: The pulmonic valve was normal in structure. Pulmonic valve regurgitation is trivial. No evidence of pulmonic stenosis. Aorta: The aortic root is normal in size and structure. Venous: The inferior vena cava is normal in size with greater than 50% respiratory variability, suggesting right atrial pressure of 3 mmHg. IAS/Shunts: No atrial level shunt detected by color flow Doppler.  LEFT VENTRICLE PLAX 2D LVIDd:         4.38 cm  Diastology LVIDs:         3.25 cm  LV e' medial:    5.98 cm/s LV PW:         1.23 cm  LV E/e' medial:  22.6 LV IVS:        1.30 cm  LV e' lateral:   6.53  cm/s LVOT diam:     1.70 cm  LV E/e' lateral: 20.7 LVOT Area:     2.27 cm  RIGHT VENTRICLE RV Mid diam:    3.02 cm RV S prime:     13.40 cm/s TAPSE (M-mode): 1.8 cm LEFT ATRIUM             Index       RIGHT ATRIUM           Index LA diam:        3.50 cm 2.00 cm/m  RA Area:     11.30 cm LA Vol (A2C):   43.5 ml 24.85 ml/m RA Volume:   24.40 ml  13.94 ml/m LA Vol (A4C):   37.2 ml 21.25 ml/m LA Biplane Vol: 40.2 ml 22.97 ml/m  AORTIC VALVE                PULMONIC VALVE AV Area (Vmax): 2.34 cm    PV Vmax:        0.98 m/s AV Vmax:        102.00 cm/s PV Peak grad:   3.8 mmHg AV Peak Grad:   4.2 mmHg    RVOT Peak grad: 2 mmHg LVOT Vmax:      105.00 cm/s  AORTA Ao Root diam: 2.90 cm MITRAL VALVE                TRICUSPID VALVE MV Area (PHT): 5.42 cm     TR Peak grad:   37.0 mmHg MV Decel Time: 140 msec  TR Vmax:        304.00 cm/s MV E velocity: 135.00 cm/s MV A velocity: 124.00 cm/s  SHUNTS MV E/A ratio:  1.09         Systemic Diam: 1.70 cm MV A Prime:    9.0 cm/s Neoma Laming MD Electronically signed by Neoma Laming MD Signature Date/Time: 05/21/2020/5:18:03 PM    Final      Subjective: Patient was feeling better when seen today.  She wants to go home.  Denies any chest pain or shortness of breath.  Discharge Exam: Vitals:   05/22/20 0419 05/22/20 0732  BP: 119/63 116/69  Pulse: 76 76  Resp: 20 16  Temp: 98.6 F (37 C) 98.4 F (36.9 C)  SpO2: 95% 100%   Vitals:   05/21/20 1646 05/21/20 1926 05/22/20 0419 05/22/20 0732  BP: 123/78 (!) 107/55 119/63 116/69  Pulse: 72 71 76 76  Resp:  18 20 16   Temp: 99.1 F (37.3 C) 98.3 F (36.8 C) 98.6 F (37 C) 98.4 F (36.9 C)  TempSrc: Oral Oral Oral Oral  SpO2: 93% 98% 95% 100%  Weight:      Height:        General: Pt is alert, awake, not in acute distress Cardiovascular: RRR, S1/S2 +, no rubs, no gallops Respiratory: CTA bilaterally, no wheezing, no rhonchi Abdominal: Soft, NT, ND, bowel sounds + Extremities: no edema, no  cyanosis   The results of significant diagnostics from this hospitalization (including imaging, microbiology, ancillary and laboratory) are listed below for reference.    Microbiology: Recent Results (from the past 240 hour(s))  Respiratory Panel by RT PCR (Flu A&B, Covid) - Nasopharyngeal Swab     Status: None   Collection Time: 05/20/20 12:49 AM   Specimen: Nasopharyngeal Swab  Result Value Ref Range Status   SARS Coronavirus 2 by RT PCR NEGATIVE NEGATIVE Final    Comment: (NOTE) SARS-CoV-2 target nucleic acids are NOT DETECTED.  The SARS-CoV-2 RNA is generally detectable in upper respiratoy specimens during the acute phase of infection. The lowest concentration of SARS-CoV-2 viral copies this assay can detect is 131 copies/mL. A negative result does not preclude SARS-Cov-2 infection and should not be used as the sole basis for treatment or other patient management decisions. A negative result may occur with  improper specimen collection/handling, submission of specimen other than nasopharyngeal swab, presence of viral mutation(s) within the areas targeted by this assay, and inadequate number of viral copies (<131 copies/mL). A negative result must be combined with clinical observations, patient history, and epidemiological information. The expected result is Negative.  Fact Sheet for Patients:  PinkCheek.be  Fact Sheet for Healthcare Providers:  GravelBags.it  This test is no t yet approved or cleared by the [place] FDA and  has been authorized for detection and/or diagnosis of SARS-CoV-2 by FDA under an Emergency Use Authorization (EUA). This EUA will remain  in effect (meaning this test can be used) for the duration of the COVID-19 declaration under Section 564(b)(1) of the Act, 21 U.S.C. section 360bbb-3(b)(1), unless the authorization is terminated or revoked sooner.     Influenza A by PCR NEGATIVE  NEGATIVE Final   Influenza B by PCR NEGATIVE NEGATIVE Final    Comment: (NOTE) The Xpert Xpress SARS-CoV-2/FLU/RSV assay is intended as an aid in  the diagnosis of influenza from Nasopharyngeal swab specimens and  should not be used as a sole basis for treatment. Nasal washings and  aspirates are unacceptable for Xpert Xpress SARS-CoV-2/FLU/RSV  testing.  Fact Sheet for Patients: PinkCheek.be  Fact Sheet for Healthcare Providers: GravelBags.it  This test is not yet approved or cleared by the [place] FDA and  has been authorized for detection and/or diagnosis of SARS-CoV-2 by  FDA under an Emergency Use Authorization (EUA). This EUA will remain  in effect (meaning this test can be used) for the duration of the  Covid-19 declaration under Section 564(b)(1) of the Act, 21  U.S.C. section 360bbb-3(b)(1), unless the authorization is  terminated or revoked. Performed at [facility], [address] [place], [place] [address]   MRSA PCR Screening     Status: None   Collection Time: 05/20/20  3:30 AM   Specimen: Nasopharyngeal  Result Value Ref Range Status   MRSA by PCR NEGATIVE NEGATIVE Final    Comment:        The GeneXpert MRSA Assay (FDA approved for NASAL specimens only), is one component of a comprehensive MRSA colonization surveillance program. It is not intended to diagnose MRSA infection nor to guide or monitor treatment for MRSA infections. Performed at [facility], [address] [place], [place] [address]      Labs: BNP (last 3 results) No results for input(s): BNP in the last 8760 hours. Basic Metabolic Panel: Recent Labs  Lab 05/19/20 2007 05/20/20 0540 05/22/20 0420  NA 134* 130* 133*  K 5.0 4.5 4.0  CL 97* 96* 101  CO2 23 23 23   GLUCOSE 327* 202* 174*  BUN 19 17 24*  CREATININE 0.97 0.82 1.10*  CALCIUM 9.3 8.8* 8.4*   Liver Function Tests: Recent Labs  Lab  05/19/20 2007  AST 19  ALT 17  ALKPHOS 119  BILITOT 1.1  PROT 7.8  ALBUMIN 4.5   No results for input(s): LIPASE, AMYLASE in the last 168 hours. No results for input(s): AMMONIA in the last 168 hours. CBC: Recent Labs  Lab 05/19/20 2007 05/20/20 0540 05/22/20 0420  WBC 6.2 7.5 5.9  HGB 12.9 12.0 10.2*  HCT 37.9 34.6* 30.4*  MCV 80.0 79.0* 80.9  PLT 181 173 160   Cardiac Enzymes: No results for input(s): CKTOTAL, CKMB, CKMBINDEX, TROPONINI in the last 168 hours. BNP: Invalid input(s): POCBNP CBG: Recent Labs  Lab 05/21/20 0721 05/21/20 1153 05/21/20 1649 05/21/20 2051 05/22/20 0729  GLUCAP 118* 139* 169* 155* 145*   D-Dimer No results for input(s): DDIMER in the last 72 hours. Hgb A1c Recent Labs    05/20/20 0540  HGBA1C 8.3*   Lipid Profile No results for input(s): CHOL, HDL, LDLCALC, TRIG, CHOLHDL, LDLDIRECT in the last 72 hours. Thyroid function studies No results for input(s): TSH, T4TOTAL, T3FREE, THYROIDAB in the last 72 hours.  Invalid input(s): FREET3 Anemia work up No results for input(s): VITAMINB12, FOLATE, FERRITIN, TIBC, IRON, RETICCTPCT in the last 72 hours. Urinalysis No results found for: COLORURINE, APPEARANCEUR, [place], [place], [place], [place], [place], [place], PROTEINUR, UROBILINOGEN, NITRITE, LEUKOCYTESUR Sepsis Labs Invalid input(s): PROCALCITONIN,  WBC,  LACTICIDVEN Microbiology Recent Results (from the past 240 hour(s))  Respiratory Panel by RT PCR (Flu A&B, Covid) - Nasopharyngeal Swab     Status: None   Collection Time: 05/20/20 12:49 AM   Specimen: Nasopharyngeal Swab  Result Value Ref Range Status   SARS Coronavirus 2 by RT PCR NEGATIVE NEGATIVE Final    Comment: (NOTE) SARS-CoV-2 target nucleic acids are NOT DETECTED.  The SARS-CoV-2 RNA is generally detectable in upper respiratoy specimens during the acute phase of infection. The lowest concentration of SARS-CoV-2 viral  copies this assay can detect is 131  copies/mL. A negative result does not preclude SARS-Cov-2 infection and should not be used as the sole basis for treatment or other patient management decisions. A negative result may occur with  improper specimen collection/handling, submission of specimen other than nasopharyngeal swab, presence of viral mutation(s) within the areas targeted by this assay, and inadequate number of viral copies (<131 copies/mL). A negative result must be combined with clinical observations, patient history, and epidemiological information. The expected result is Negative.  Fact Sheet for Patients:  PinkCheek.be  Fact Sheet for Healthcare Providers:  GravelBags.it  This test is no t yet approved or cleared by the [place] FDA and  has been authorized for detection and/or diagnosis of SARS-CoV-2 by FDA under an Emergency Use Authorization (EUA). This EUA will remain  in effect (meaning this test can be used) for the duration of the COVID-19 declaration under Section 564(b)(1) of the Act, 21 U.S.C. section 360bbb-3(b)(1), unless the authorization is terminated or revoked sooner.     Influenza A by PCR NEGATIVE NEGATIVE Final   Influenza B by PCR NEGATIVE NEGATIVE Final    Comment: (NOTE) The Xpert Xpress SARS-CoV-2/FLU/RSV assay is intended as an aid in  the diagnosis of influenza from Nasopharyngeal swab specimens and  should not be used as a sole basis for treatment. Nasal washings and  aspirates are unacceptable for Xpert Xpress SARS-CoV-2/FLU/RSV  testing.  Fact Sheet for Patients: PinkCheek.be  Fact Sheet for Healthcare Providers: GravelBags.it  This test is not yet approved or cleared by the [place] FDA and  has been authorized for detection and/or diagnosis of SARS-CoV-2 by  FDA under an Emergency Use Authorization (EUA). This EUA will remain  in effect (meaning  this test can be used) for the duration of the  Covid-19 declaration under Section 564(b)(1) of the Act, 21  U.S.C. section 360bbb-3(b)(1), unless the authorization is  terminated or revoked. Performed at [facility], [address] [place], [place] [address]   MRSA PCR Screening     Status: None   Collection Time: 05/20/20  3:30 AM   Specimen: Nasopharyngeal  Result Value Ref Range Status   MRSA by PCR NEGATIVE NEGATIVE Final    Comment:        The GeneXpert MRSA Assay (FDA approved for NASAL specimens only), is one component of a comprehensive MRSA colonization surveillance program. It is not intended to diagnose MRSA infection nor to guide or monitor treatment for MRSA infections. Performed at [facility], [address] [place], [place] [address]     Time coordinating discharge: Over 30 minutes  SIGNED:  Lorella Nimrod, MD  [facility] 05/22/2020, 11:07 AM  If 7PM-7AM, please contact night-coverage [email or web address]  This record has been created using Systems analyst. Errors have been sought and corrected,but may not always be located. Such creation errors do not reflect on the standard of care.

## 2020-05-22 NOTE — Progress Notes (Signed)
SUBJECTIVE: Patient denies any chest pain but has some epigastric discomfort   Vitals:   05/21/20 1646 05/21/20 1926 05/22/20 0419 05/22/20 0732  BP: 123/78 (!) 107/55 119/63 116/69  Pulse: 72 71 76 76  Resp:  18 20 16   Temp: 99.1 F (37.3 C) 98.3 F (36.8 C) 98.6 F (37 C) 98.4 F (36.9 C)  TempSrc: Oral Oral Oral Oral  SpO2: 93% 98% 95% 100%  Weight:      Height:        Intake/Output Summary (Last 24 hours) at 05/22/2020 1012 Last data filed at 05/21/2020 1350 Gross per 24 hour  Intake 240 ml  Output --  Net 240 ml    LABS: Basic Metabolic Panel: Recent Labs    05/20/20 0540 05/22/20 0420  NA 130* 133*  K 4.5 4.0  CL 96* 101  CO2 23 23  GLUCOSE 202* 174*  BUN 17 24*  CREATININE 0.82 1.10*  CALCIUM 8.8* 8.4*   Liver Function Tests: Recent Labs    05/19/20 2007  AST 19  ALT 17  ALKPHOS 119  BILITOT 1.1  PROT 7.8  ALBUMIN 4.5   No results for input(s): LIPASE, AMYLASE in the last 72 hours. CBC: Recent Labs    05/20/20 0540 05/22/20 0420  WBC 7.5 5.9  HGB 12.0 10.2*  HCT 34.6* 30.4*  MCV 79.0* 80.9  PLT 173 160   Cardiac Enzymes: No results for input(s): CKTOTAL, CKMB, CKMBINDEX, TROPONINI in the last 72 hours. BNP: Invalid input(s): POCBNP D-Dimer: No results for input(s): DDIMER in the last 72 hours. Hemoglobin A1C: Recent Labs    05/20/20 0540  HGBA1C 8.3*   Fasting Lipid Panel: No results for input(s): CHOL, HDL, LDLCALC, TRIG, CHOLHDL, LDLDIRECT in the last 72 hours. Thyroid Function Tests: No results for input(s): TSH, T4TOTAL, T3FREE, THYROIDAB in the last 72 hours.  Invalid input(s): FREET3 Anemia Panel: No results for input(s): VITAMINB12, FOLATE, FERRITIN, TIBC, IRON, RETICCTPCT in the last 72 hours.   PHYSICAL EXAM General: Well developed, well nourished, in no acute distress HEENT:  Normocephalic and atramatic Neck:  No JVD.  Lungs: Clear bilaterally to auscultation and percussion. Heart: HRRR . Normal S1 and S2  without gallops or murmurs.  Abdomen: Bowel sounds are positive, abdomen soft and non-tender  Msk:  Back normal, normal gait. Normal strength and tone for age. Extremities: No clubbing, cyanosis or edema.   Neuro: Alert and oriented X 3. Psych:  Good affect, responds appropriately  TELEMETRY: Sinus rhythm  ASSESSMENT AND PLAN: Status post non-STEMI with inferolateral myocardial infarction and PTCA and stenting of OM1 and mid left circumflex.  Patient is doing well may have some acid reflux advise starting the patient on Protonix once a day.  Patient can be discharged with follow-up Minta Balsam in 1 to 2 weeks.  Active Problems:   STEMI involving oth coronary artery of inferior wall (HCC)   STEMI (ST elevation myocardial infarction) ([place])    Sung Parodi A, MD, [facility] 05/22/2020 10:12 AM

## 2020-05-22 NOTE — Plan of Care (Signed)
Pt dc home per MD order, no acute distress noted, pt dc instructions and follow up appointment gone over with pt and daughter Problem: Education: Goal: Knowledge of General Education information will improve Description: Including pain rating scale, medication(s)/side effects and non-pharmacologic comfort measures Outcome: Adequate for Discharge   Problem: Health Behavior/Discharge Planning: Goal: Ability to manage health-related needs will improve Outcome: Adequate for Discharge   Problem: Clinical Measurements: Goal: Ability to maintain clinical measurements within normal limits will improve Outcome: Adequate for Discharge Goal: Will remain free from infection Outcome: Adequate for Discharge Goal: Diagnostic test results will improve Outcome: Adequate for Discharge Goal: Respiratory complications will improve Outcome: Adequate for Discharge Goal: Cardiovascular complication will be avoided Outcome: Adequate for Discharge   Problem: Activity: Goal: Risk for activity intolerance will decrease Outcome: Adequate for Discharge   Problem: Nutrition: Goal: Adequate nutrition will be maintained Outcome: Adequate for Discharge   Problem: Coping: Goal: Level of anxiety will decrease Outcome: Adequate for Discharge   Problem: Elimination: Goal: Will not experience complications related to bowel motility Outcome: Adequate for Discharge Goal: Will not experience complications related to urinary retention Outcome: Adequate for Discharge   Problem: Pain Managment: Goal: General experience of comfort will improve Outcome: Adequate for Discharge   Problem: Safety: Goal: Ability to remain free from injury will improve Outcome: Adequate for Discharge   Problem: Skin Integrity: Goal: Risk for impaired skin integrity will decrease Outcome: Adequate for Discharge  , all questions answered.

## 2020-07-25 DIAGNOSIS — Z0000 Encounter for general adult medical examination without abnormal findings: Secondary | ICD-10-CM | POA: Diagnosis not present

## 2020-11-09 DIAGNOSIS — F4001 Agoraphobia with panic disorder: Secondary | ICD-10-CM | POA: Diagnosis not present

## 2021-02-17 DIAGNOSIS — F4001 Agoraphobia with panic disorder: Secondary | ICD-10-CM | POA: Diagnosis not present

## 2021-03-13 ENCOUNTER — Emergency Department: Payer: PPO

## 2021-03-13 ENCOUNTER — Emergency Department
Admission: EM | Admit: 2021-03-13 | Discharge: 2021-03-13 | Disposition: A | Discharge status: Home / Self Care | Payer: PPO | Attending: Emergency Medicine | Admitting: Emergency Medicine

## 2021-03-13 ENCOUNTER — Other Ambulatory Visit
Admission: RE | Admit: 2021-03-13 | Discharge: 2021-03-13 | Disposition: A | Discharge status: Home / Self Care | Payer: PPO | Source: Ambulatory Visit | Attending: Family Medicine | Admitting: Family Medicine

## 2021-03-13 ENCOUNTER — Other Ambulatory Visit: Payer: Self-pay

## 2021-03-13 DIAGNOSIS — Z7982 Long term (current) use of aspirin: Secondary | ICD-10-CM | POA: Insufficient documentation

## 2021-03-13 DIAGNOSIS — Z20822 Contact with and (suspected) exposure to covid-19: Secondary | ICD-10-CM | POA: Diagnosis not present

## 2021-03-13 DIAGNOSIS — R0600 Dyspnea, unspecified: Secondary | ICD-10-CM | POA: Diagnosis not present

## 2021-03-13 DIAGNOSIS — R053 Chronic cough: Secondary | ICD-10-CM | POA: Insufficient documentation

## 2021-03-13 DIAGNOSIS — J90 Pleural effusion, not elsewhere classified: Secondary | ICD-10-CM | POA: Diagnosis not present

## 2021-03-13 DIAGNOSIS — R197 Diarrhea, unspecified: Secondary | ICD-10-CM | POA: Insufficient documentation

## 2021-03-13 DIAGNOSIS — K8020 Calculus of gallbladder without cholecystitis without obstruction: Secondary | ICD-10-CM | POA: Diagnosis not present

## 2021-03-13 DIAGNOSIS — J069 Acute upper respiratory infection, unspecified: Secondary | ICD-10-CM | POA: Insufficient documentation

## 2021-03-13 DIAGNOSIS — R0601 Orthopnea: Secondary | ICD-10-CM | POA: Diagnosis not present

## 2021-03-13 DIAGNOSIS — I10 Essential (primary) hypertension: Secondary | ICD-10-CM | POA: Diagnosis not present

## 2021-03-13 DIAGNOSIS — R0602 Shortness of breath: Secondary | ICD-10-CM | POA: Diagnosis not present

## 2021-03-13 DIAGNOSIS — J9811 Atelectasis: Secondary | ICD-10-CM | POA: Diagnosis not present

## 2021-03-13 DIAGNOSIS — R059 Cough, unspecified: Secondary | ICD-10-CM | POA: Diagnosis not present

## 2021-03-13 DIAGNOSIS — Z79899 Other long term (current) drug therapy: Secondary | ICD-10-CM | POA: Diagnosis not present

## 2021-03-13 DIAGNOSIS — D649 Anemia, unspecified: Secondary | ICD-10-CM | POA: Diagnosis not present

## 2021-03-13 DIAGNOSIS — E119 Type 2 diabetes mellitus without complications: Secondary | ICD-10-CM | POA: Diagnosis not present

## 2021-03-13 DIAGNOSIS — Z03818 Encounter for observation for suspected exposure to other biological agents ruled out: Secondary | ICD-10-CM | POA: Diagnosis not present

## 2021-03-13 DIAGNOSIS — I700 Atherosclerosis of aorta: Secondary | ICD-10-CM | POA: Diagnosis not present

## 2021-03-13 LAB — CBC WITH DIFFERENTIAL/PLATELET
Abs Immature Granulocytes: 0.01 10*3/uL (ref 0.00–0.07)
Basophils Absolute: 0 10*3/uL (ref 0.0–0.1)
Basophils Relative: 1 %
Eosinophils Absolute: 0.2 10*3/uL (ref 0.0–0.5)
Eosinophils Relative: 5 %
HCT: 30.5 % — ABNORMAL LOW (ref 36.0–46.0)
Hemoglobin: 9.3 g/dL — ABNORMAL LOW (ref 12.0–15.0)
Immature Granulocytes: 0 %
Lymphocytes Relative: 19 %
Lymphs Abs: 0.8 10*3/uL (ref 0.7–4.0)
MCH: 23.8 pg — ABNORMAL LOW (ref 26.0–34.0)
MCHC: 30.5 g/dL (ref 30.0–36.0)
MCV: 78 fL — ABNORMAL LOW (ref 80.0–100.0)
Monocytes Absolute: 0.3 10*3/uL (ref 0.1–1.0)
Monocytes Relative: 7 %
Neutro Abs: 2.9 10*3/uL (ref 1.7–7.7)
Neutrophils Relative %: 68 %
Platelets: 209 10*3/uL (ref 150–400)
RBC: 3.91 MIL/uL (ref 3.87–5.11)
RDW: 18 % — ABNORMAL HIGH (ref 11.5–15.5)
WBC: 4.3 10*3/uL (ref 4.0–10.5)
nRBC: 0 % (ref 0.0–0.2)

## 2021-03-13 LAB — COMPREHENSIVE METABOLIC PANEL
ALT: 9 U/L (ref 0–44)
AST: 17 U/L (ref 15–41)
Albumin: 4.1 g/dL (ref 3.5–5.0)
Alkaline Phosphatase: 105 U/L (ref 38–126)
Anion gap: 10 (ref 5–15)
BUN: 13 mg/dL (ref 8–23)
CO2: 25 mmol/L (ref 22–32)
Calcium: 9 mg/dL (ref 8.9–10.3)
Chloride: 101 mmol/L (ref 98–111)
Creatinine, Ser: 0.87 mg/dL (ref 0.44–1.00)
GFR, Estimated: >60 mL/min (ref >60)
Glucose, Bld: 155 mg/dL — ABNORMAL HIGH (ref 70–99)
Potassium: 3.8 mmol/L (ref 3.5–5.1)
Sodium: 136 mmol/L (ref 135–145)
Total Bilirubin: 1.1 mg/dL (ref 0.3–1.2)
Total Protein: 7.1 g/dL (ref 6.5–8.1)

## 2021-03-13 LAB — BRAIN NATRIURETIC PEPTIDE
B Natriuretic Peptide: 845.2 pg/mL — ABNORMAL HIGH (ref 0.0–100.0)
B Natriuretic Peptide: 787.2 pg/mL — ABNORMAL HIGH (ref 0.0–100.0)

## 2021-03-13 LAB — TROPONIN I (HIGH SENSITIVITY)
Troponin I (High Sensitivity): 15 ng/L (ref <18)
Troponin I (High Sensitivity): 15 ng/L (ref <18)

## 2021-03-13 MED ORDER — METOPROLOL TARTRATE 25 MG PO TABS: 25.0000 mg | ORAL_TABLET | Freq: Two times a day (BID) | ORAL | 0 refills | Status: DC

## 2021-03-13 MED ORDER — LISINOPRIL 2.5 MG PO TABS: 2.5000 mg | ORAL_TABLET | Freq: Every day | ORAL | 1 refills | Status: DC

## 2021-03-13 MED ORDER — TICAGRELOR 90 MG PO TABS: 90.0000 mg | ORAL_TABLET | Freq: Two times a day (BID) | ORAL | 1 refills | Status: DC

## 2021-03-13 MED ORDER — FUROSEMIDE 10 MG/ML IJ SOLN
40.0000 mg | Freq: Once | INTRAMUSCULAR | Status: AC
  Administered 2021-03-13: 40 mg via INTRAVENOUS
  Filled 2021-03-13: qty 4

## 2021-03-13 MED ORDER — ASPIRIN 81 MG PO CHEW: 81.0000 mg | CHEWABLE_TABLET | Freq: Every day | ORAL | 0 refills | Status: DC

## 2021-03-13 MED ORDER — ATORVASTATIN CALCIUM 80 MG PO TABS: 80.0000 mg | ORAL_TABLET | Freq: Every day | ORAL | 1 refills | Status: DC

## 2021-03-13 MED ORDER — IOHEXOL 350 MG/ML SOLN
75.0000 mL | Freq: Once | INTRAVENOUS | Status: AC | PRN
  Administered 2021-03-13: 75 mL via INTRAVENOUS
  Filled 2021-03-13: qty 75

## 2021-03-13 NOTE — ED Notes (Signed)
Lav, lt grn, blue, red tubes sent to lab.  °

## 2021-03-13 NOTE — ED Provider Notes (Signed)
[facility]-EMERGENCY DEPARTMENT  ____________________________________________  Time seen: Approximately 5:59 PM  I have reviewed the triage vital signs and the nursing notes.   HISTORY  Chief Complaint Shortness of Breath and URI   Historian Patient    HPI Emily Jones is a 80 y.o. female with a history of type 2 diabetes, dyslipidemia, hypertension, GERD and Inferior MI requiring revascularization with cardiac catheterization, presents to the emergency department with progressively worsening shortness of breath.  In September 2021, cardiac catheterization revealed stenosis of OM1 and mid left circumflex.  Post cardiac cath, echo with normal ejection fraction and grade 2 diastolic dysfunction.  Patient was started on aspirin, Brilinta, Lipitor, lisinopril and metoprolol.  Patient states that she notices shortness of breath most acutely at night when she tries to sleep and has been using extra pillows.  She has not noticed any increased swelling of the lower extremities.  She has been coughing but denies fever, rhinorrhea or nasal congestion.  She has had some diarrhea which is atypical for her.  She denies chest pain or chest tightness.  No pain in the upper back.  Patient reports that she ran out of her Brillanta several weeks ago and never refilled medication.   Past Medical History:  Diagnosis Date   Allergic state    Costochondritis    Depression    Diabetes mellitus without complication (HCC)    Esophagitis    GERD (gastroesophageal reflux disease)    Headache    Hemorrhoids    Hyperlipidemia    Hypertension    Panic attacks      Immunizations up to date:  Yes.     Past Medical History:  Diagnosis Date   Allergic state    Costochondritis    Depression    Diabetes mellitus without complication (HCC)    Esophagitis    GERD (gastroesophageal reflux disease)    Headache    Hemorrhoids    Hyperlipidemia    Hypertension    Panic attacks     Patient Active Problem  List   Diagnosis Date Noted   STEMI involving oth coronary artery of inferior wall ([place]) 05/20/2020   STEMI (ST elevation myocardial infarction) ([place]) 05/20/2020    Past Surgical History:  Procedure Laterality Date   APPENDECTOMY     COLONOSCOPY N/A 03/29/2017   Procedure: COLONOSCOPY;  Surgeon: Manya Silvas, MD;  Location: [facility] ENDOSCOPY;  Service: Endoscopy;  Laterality: N/A;   CORONARY/GRAFT ACUTE MI REVASCULARIZATION N/A 05/20/2020   Procedure: Coronary/Graft Acute MI Revascularization;  Surgeon: Isaias Cowman, MD;  Location: [place] CV LAB;  Service: Cardiovascular;  Laterality: N/A;   DILATION AND CURETTAGE, DIAGNOSTIC / THERAPEUTIC     ESOPHAGOGASTRODUODENOSCOPY (EGD) WITH PROPOFOL N/A 03/29/2017   Procedure: ESOPHAGOGASTRODUODENOSCOPY (EGD) WITH PROPOFOL;  Surgeon: Manya Silvas, MD;  Location: [facility] ENDOSCOPY;  Service: Endoscopy;  Laterality: N/A;   KNEE SURGERY     LEFT HEART CATH AND CORONARY ANGIOGRAPHY N/A 05/20/2020   Procedure: LEFT HEART CATH AND CORONARY ANGIOGRAPHY;  Surgeon: Isaias Cowman, MD;  Location: [place] [facility];  Service: Cardiovascular;  Laterality: N/A;    Prior to Admission medications   Medication Sig Start Date End Date Taking? Authorizing Provider  acetaminophen (TYLENOL) 325 MG tablet Take by mouth every 6 (six) hours as needed.    [provider]  aspirin 81 MG chewable tablet Chew 1 tablet (81 mg total) by mouth daily. 03/13/21   Lannie Fields, PA-C  atorvastatin (LIPITOR) 80 MG tablet Take 1  tablet (80 mg total) by mouth daily. 03/13/21   Lannie Fields, PA-C  clonazePAM (KLONOPIN) 0.5 MG tablet Take 0.25 mg by mouth daily.    [provider]  lisinopril (ZESTRIL) 2.5 MG tablet Take 1 tablet (2.5 mg total) by mouth daily. 03/13/21   Lannie Fields, PA-C  meclizine (ANTIVERT) 25 MG tablet Take 25 mg by mouth 3 (three) times daily as needed for dizziness.    [provider]  metoprolol tartrate  (LOPRESSOR) 25 MG tablet Take 1 tablet (25 mg total) by mouth 2 (two) times daily. 03/13/21   Lannie Fields, PA-C  mupirocin ointment (BACTROBAN) 2 % Place 1 application into the nose 2 (two) times daily.    [provider]  nitroGLYCERIN (NITROSTAT) 0.4 MG SL tablet Place 1 tablet (0.4 mg total) under the tongue every 5 (five) minutes as needed for chest pain. 05/22/20   Lorella Nimrod, MD  omega-3 acid ethyl esters (LOVAZA) 1 g capsule Take 1 g by mouth daily.    [provider]  omeprazole (PRILOSEC) 20 MG capsule Take 20 mg by mouth daily.    [provider]  sertraline (ZOLOFT) 100 MG tablet Take 100 mg by mouth daily.    [provider]  ticagrelor (BRILINTA) 90 MG TABS tablet Take 1 tablet (90 mg total) by mouth 2 (two) times daily. 03/13/21   Lannie Fields, PA-C  VITAMIN C, CALCIUM ASCORBATE, PO Take 1 tablet by mouth daily.    [provider]    Allergies Aspirin, Chlorpheniramine maleate, Codeine, Statins, Sulfa antibiotics, and Penicillins  No family history on file.  Social History Social History   Tobacco Use   Smoking status: Never   Smokeless tobacco: Never  Substance Use Topics   Alcohol use: No   Drug use: No     Review of Systems  Constitutional: No fever/chills Eyes:  No discharge ENT: No upper respiratory complaints. Respiratory: Patient has cough and shortness of breath.  Gastrointestinal:   No nausea, no vomiting.  No diarrhea.  No constipation. Musculoskeletal: Negative for musculoskeletal pain. Skin: Negative for rash, abrasions, lacerations, ecchymosis.    ____________________________________________   PHYSICAL EXAM:  VITAL SIGNS: ED Triage Vitals [03/13/21 1639]  Enc Vitals Group     BP (!) 169/88     Pulse Rate 89     Resp 18     Temp 98 F (36.7 C)     Temp Source Oral     SpO2 98 %     Weight 150 lb (68 kg)     Height 5\' 5"  (1.651 m)     Head Circumference      Peak Flow      Pain Score       Pain Loc      Pain Edu?      Excl. in [place]?      Constitutional: Alert and oriented. Well appearing and in no acute distress. Eyes: Conjunctivae are normal. PERRL. EOMI. Head: Atraumatic. ENT:      Nose: No congestion/rhinnorhea.      Mouth/Throat: Mucous membranes are moist.  Neck: No stridor.  No cervical spine tenderness to palpation. Cardiovascular: Normal rate, regular rhythm. Normal S1 and S2.  Good peripheral circulation. Respiratory: Normal respiratory effort without tachypnea or retractions. Lungs CTAB. Good air entry to the bases with no decreased or absent breath sounds Gastrointestinal: Bowel sounds x 4 quadrants. Soft and nontender to palpation. No guarding or rigidity. No distention. Musculoskeletal: Full range  of motion to all extremities. No obvious deformities noted Neurologic:  Normal for age. No gross focal neurologic deficits are appreciated.  Skin:  Skin is warm, dry and intact. No rash noted. Psychiatric: Mood and affect are normal for age. Speech and behavior are normal.   ____________________________________________   LABS (all labs ordered are listed, but only abnormal results are displayed)  Labs Reviewed  CBC WITH DIFFERENTIAL/PLATELET - Abnormal; Notable for the following components:      Result Value   Hemoglobin 9.3 (*)    HCT 30.5 (*)    MCV 78.0 (*)    MCH 23.8 (*)    RDW 18.0 (*)    All other components within normal limits  COMPREHENSIVE METABOLIC PANEL - Abnormal; Notable for the following components:   Glucose, Bld 155 (*)    All other components within normal limits  BRAIN NATRIURETIC PEPTIDE - Abnormal; Notable for the following components:   B Natriuretic Peptide 787.2 (*)    All other components within normal limits  TROPONIN I (HIGH SENSITIVITY)  TROPONIN I (HIGH SENSITIVITY)   ____________________________________________  EKG   ____________________________________________  RADIOLOGY Unk Pinto, personally viewed  and evaluated these images (plain radiographs) as part of my medical decision making, as well as reviewing the written report by the radiologist.  DG Chest 2 View  Result Date: 03/13/2021 CLINICAL DATA:  Short of breath, cough EXAM: CHEST - 2 VIEW COMPARISON:  05/19/2020 FINDINGS: Frontal and lateral views of the chest demonstrate an unremarkable cardiac silhouette. There is increased central vascular congestion with small bilateral pleural effusions. Mild diffuse interstitial prominence. No airspace disease or pneumothorax. No acute bony abnormalities. IMPRESSION: 1. Findings most consistent with mild interstitial edema. No acute airspace disease. 2. Small bilateral pleural effusions. Electronically Signed   By: Randa Ngo M.D.   On: 03/13/2021 17:20   CT Angio Chest PE W and/or Wo Contrast  Result Date: 03/13/2021 CLINICAL DATA:  Cough, short of breath, sore throat for 2 weeks EXAM: CT ANGIOGRAPHY CHEST WITH CONTRAST TECHNIQUE: Multidetector CT imaging of the chest was performed using the standard protocol during bolus administration of intravenous contrast. Multiplanar CT image reconstructions and MIPs were obtained to evaluate the vascular anatomy. CONTRAST:  63mL OMNIPAQUE IOHEXOL 350 MG/ML SOLN COMPARISON:  03/13/2021 FINDINGS: Cardiovascular: This is a technically adequate evaluation of the pulmonary vasculature. There are no filling defects or pulmonary emboli. The heart is unremarkable without pericardial effusion. No evidence of thoracic aortic aneurysm. Moderate atherosclerosis of the aorta and coronary vasculature. Mediastinum/Nodes: No enlarged mediastinal, hilar, or axillary lymph nodes. Thyroid gland, trachea, and esophagus demonstrate no significant findings. Lungs/Pleura: There are bilateral pleural effusions, volume estimated in excess of 1 L each. Dependent areas of consolidation within the lower lobes consistent with atelectasis. There is interlobular septal thickening compatible with  interstitial edema. No acute airspace disease or pneumothorax. The central airways are patent. Upper Abdomen: Calcified gallstones are identified without cholecystitis. Remainder of the visualized upper abdomen is unremarkable. Musculoskeletal: No acute or destructive bony lesions. Left convex scoliosis of the lower thoracic spine. Reconstructed images demonstrate no additional findings. Review of the MIP images confirms the above findings. IMPRESSION: 1. No evidence of pulmonary embolus. 2. Interstitial edema, with moderate bilateral pleural effusions. No acute airspace disease. 3. Cholelithiasis without cholecystitis. 4. Aortic Atherosclerosis (ICD10-I70.0). Coronary artery atherosclerosis. Electronically Signed   By: Randa Ngo M.D.   On: 03/13/2021 19:18    ____________________________________________    PROCEDURES  Procedure(s) performed:  Procedures     Medications  iohexol (OMNIPAQUE) 350 MG/ML injection 75 mL (75 mLs Intravenous Contrast Given 03/13/21 1853)  furosemide (LASIX) injection 40 mg (40 mg Intravenous Given 03/13/21 2005)     ____________________________________________   INITIAL IMPRESSION / ASSESSMENT AND PLAN / ED COURSE  Pertinent labs & imaging results that were available during my care of the patient were reviewed by me and considered in my medical decision making (see chart for details).      Assessment and Plan:  Shortness of breath 80 year old female presents to the emergency department with new onset orthopnea and shortness of breath during the day.  Vital signs are reassuring at triage.  On physical exam, patient did seem breathless on exam.  She had no pitting edema bilaterally.  She had no wheezes, rales or rhonchi on exam.  CBC indicated hemoglobin of 9.3 but was otherwise reassuring.  CMP within reference range.  Both sets of troponin were within reference range.  BNP was elevated at 787.  Patient stopped Brilinta prematurely without  consent from her primary care or cardiology and has also stopped her other blood pressure medications.  CTA showed no evidence of PE but did show moderate interstitial edema.  Suspect new onset CHF secondary to uncontrolled hypertension at home.   Patient was advised to follow-up with the CHF clinic.  Discussed patient's case with attending, Dr. Gardenia Phlegm who agrees with plan of care at this time..  Patient was represcribed her lisinopril, Brilinta, metoprolol and daily aspirin and advised her to take these medications as directed by her cardiologist until they are discontinued by cardiology or her primary care provider.  Return precautions were given to return with new or worsening symptoms.  All patient questions were answered.      ____________________________________________  FINAL CLINICAL IMPRESSION(S) / ED DIAGNOSES  Final diagnoses:  Shortness of breath      NEW MEDICATIONS STARTED DURING THIS VISIT:  ED Discharge Orders          Ordered    metoprolol tartrate (LOPRESSOR) 25 MG tablet  2 times daily        03/13/21 2041    ticagrelor (BRILINTA) 90 MG TABS tablet  2 times daily        03/13/21 2041    lisinopril (ZESTRIL) 2.5 MG tablet  Daily        03/13/21 2041    aspirin 81 MG chewable tablet  Daily        03/13/21 2041    atorvastatin (LIPITOR) 80 MG tablet  Daily        03/13/21 2041                This chart was dictated using voice recognition software/Dragon. Despite best efforts to proofread, errors can occur which can change the meaning. Any change was purely unintentional.     Lannie Fields, PA-C 03/13/21 2249    Vladimir Crofts, MD 03/14/21 0010

## 2021-03-13 NOTE — ED Notes (Signed)
Pt reports recent diarrhea, SOB, and throat soreness. Denies fever, CP, history of resp conditions. Currently resp reg/unlabored, skin dry, and pt sitting calmly on stretcher.

## 2021-03-13 NOTE — Discharge Instructions (Addendum)
You need to be seen and evaluated at the congestive heart failure clinic. Please call clinic to make an appointment. Please restart your lisinopril, metoprolol, Lipitor, daily aspirin and Brilinta until your cardiologist discontinues medications.

## 2021-03-13 NOTE — ED Triage Notes (Signed)
Pt sent from [facility] with c/o SOB, cough , sore throat for the past 2 weeks. Pt is in NAD on arrival, speaking in complete sentences.

## 2021-03-16 DIAGNOSIS — I10 Essential (primary) hypertension: Secondary | ICD-10-CM | POA: Diagnosis not present

## 2021-03-16 DIAGNOSIS — E785 Hyperlipidemia, unspecified: Secondary | ICD-10-CM | POA: Diagnosis not present

## 2021-03-16 DIAGNOSIS — R634 Abnormal weight loss: Secondary | ICD-10-CM | POA: Diagnosis not present

## 2021-03-16 DIAGNOSIS — D649 Anemia, unspecified: Secondary | ICD-10-CM | POA: Diagnosis not present

## 2021-03-16 DIAGNOSIS — E1165 Type 2 diabetes mellitus with hyperglycemia: Secondary | ICD-10-CM | POA: Diagnosis not present

## 2021-03-16 DIAGNOSIS — N1831 Chronic kidney disease, stage 3a: Secondary | ICD-10-CM | POA: Diagnosis not present

## 2021-03-16 DIAGNOSIS — I2510 Atherosclerotic heart disease of native coronary artery without angina pectoris: Secondary | ICD-10-CM | POA: Diagnosis not present

## 2021-04-05 NOTE — Progress Notes (Signed)
Patient ID: Emily Jones, female    DOB: [birth year], 80 y.o.   MRN: [id number]  HPI  Emily Jones is a 80 y/o female with a history of CAD, DM, hyperlipidemia, HTN, anxiety, depression, GERD, panic attacks and chronic heart failure.   Echo report from 05/20/20 reviewed and showed an EF of 50-55% along with mild LAE and moderate MR.   LHC done 05/20/20 showed: 1st Mrg lesion is 100% stenosed. Mid Cx lesion is 95% stenosed. Ost Cx to Prox Cx lesion is 90% stenosed. Mid LAD lesion is 50% stenosed. Mid LAD to Dist LAD lesion is 40% stenosed. Prox RCA lesion is 30% stenosed. Dist RCA lesion is 40% stenosed. RPAV lesion is 50% stenosed. A drug-eluting stent was successfully placed using a STENT RESOLUTE ONYX 2.0X15. Post intervention, there is a 0% residual stenosis. A drug-eluting stent was successfully placed using a [place] [id number]. Post intervention, there is a 0% residual stenosis. A drug-eluting stent was successfully placed using a [employer] [id number]. Post intervention, there is a 0% residual stenosis.   1.  Inferior STEMI 2.  Acute occlusion of OM1 with high-grade 95% stenosis mid left circumflex, and ulcerated 90% stenosis proximal left circumflex 3.  Mildly reduced left ventricular function, with estimated LVEF 45 to 50%, with inferoapical hypokinesis 4.  Successful primary PCI with DES OM1, mid left circumflex, and proximal left circumflex  Was in the ED 03/13/21 due to shortness of breath. Ran out of brilinta a few weeks ago and stopped her BP medications. CTA without PE. Compliance enforced and she was released.    She presents today for her initial visit with a chief complaint of minimal shortness of breath upon moderate exertion. She describes this as chronic in nature having been present for several weeks. She has associated abdominal distention (at times), nausea, left nares nose bleed and anxiety along with this. She denies any fatigue, cough, chest pain,  pedal edema, palpitations, dizziness or difficulty sleeping.   Says that she ran out of her all medications "months ago" but since being back on them since her recent ED visit, she has started to feel better and has felt really well the last week or so. Admits to taking her medications at inconsistent times. Sometimes breakfast/ supper but then may sleep in and take them lunch/ bedtime.   Past Medical History:  Diagnosis Date   Allergic state    Anxiety    CHF (congestive heart failure) ([place])    Coronary artery disease    Costochondritis    Depression    Diabetes mellitus without complication ([place])    Esophagitis    GERD (gastroesophageal reflux disease)    Headache    Hemorrhoids    Hyperlipidemia    Hypertension    Panic attacks    Past Surgical History:  Procedure Laterality Date   APPENDECTOMY     COLONOSCOPY N/A 03/29/2017   Procedure: COLONOSCOPY;  Surgeon: Manya Silvas, MD;  Location[facility] ENDOSCOPY;  Service: Endoscopy;  Laterality: N/A;   CORONARY/GRAFT ACUTE MI REVASCULARIZATION N/A 05/20/2020   Procedure: Coronary/Graft Acute MI Revascularization;  Surgeon: Isaias Cowman, MD;  Location: [place] CV LAB;  Service: Cardiovascular;  Laterality: N/A;   DILATION AND CURETTAGE, DIAGNOSTIC / THERAPEUTIC     ESOPHAGOGASTRODUODENOSCOPY (EGD) WITH PROPOFOL N/A 03/29/2017   Procedure: ESOPHAGOGASTRODUODENOSCOPY (EGD) WITH PROPOFOL;  Surgeon: Manya Silvas, MD;  Location: [facility] ENDOSCOPY;  Service: Endoscopy;  Laterality: N/A;   KNEE SURGERY  LEFT HEART CATH AND CORONARY ANGIOGRAPHY N/A 05/20/2020   Procedure: LEFT HEART CATH AND CORONARY ANGIOGRAPHY;  Surgeon: Isaias Cowman, MD;  Location: [place] CV LAB;  Service: Cardiovascular;  Laterality: N/A;   History reviewed. No pertinent family history. Social History   Tobacco Use   Smoking status: Never   Smokeless tobacco: Never  Substance Use Topics   Alcohol use: No   Allergies  Allergen  Reactions   Aspirin    Chlorpheniramine Maleate    Codeine    Statins Other (See Comments)    Muscle Spasms   Sulfa Antibiotics    Penicillins Rash   Prior to Admission medications   Medication Sig Start Date End Date Taking? Authorizing Provider  acetaminophen (TYLENOL) 325 MG tablet Take by mouth every 6 (six) hours as needed.   Yes [provider]  aspirin 81 MG chewable tablet Chew 1 tablet (81 mg total) by mouth daily. 03/13/21  Yes Vallarie Mare M, PA-C  atorvastatin (LIPITOR) 80 MG tablet Take 1 tablet (80 mg total) by mouth daily. 03/13/21  Yes Vallarie Mare M, PA-C  clonazePAM (KLONOPIN) 0.5 MG tablet Take 0.25 mg by mouth daily.   Yes [provider]  lisinopril (ZESTRIL) 2.5 MG tablet Take 1 tablet (2.5 mg total) by mouth daily. 03/13/21  Yes Vallarie Mare M, PA-C  metoprolol tartrate (LOPRESSOR) 25 MG tablet Take 1 tablet (25 mg total) by mouth 2 (two) times daily. 03/13/21  Yes Vallarie Mare M, PA-C  nitroGLYCERIN (NITROSTAT) 0.4 MG SL tablet Place 1 tablet (0.4 mg total) under the tongue every 5 (five) minutes as needed for chest pain. 05/22/20  Yes Lorella Nimrod, MD  omeprazole (PRILOSEC) 20 MG capsule Take 20 mg by mouth daily.   Yes [provider]  sertraline (ZOLOFT) 100 MG tablet Take 100 mg by mouth daily.   Yes [provider]  ticagrelor (BRILINTA) 90 MG TABS tablet Take 1 tablet (90 mg total) by mouth 2 (two) times daily. 03/13/21  Yes Vallarie Mare M, PA-C    Review of Systems  Constitutional:  Negative for appetite change and fatigue.  HENT:  Positive for nosebleeds (left side of nares). Negative for congestion and sore throat.   Eyes: Negative.   Respiratory:  Positive for shortness of breath. Negative for cough.   Cardiovascular:  Negative for chest pain, palpitations and leg swelling.  Gastrointestinal:  Positive for abdominal distention (at times) and nausea. Negative for abdominal pain.  Endocrine: Negative.   Genitourinary:  Negative.   Musculoskeletal:  Negative for back pain and neck pain.  Skin: Negative.   Allergic/Immunologic: Negative.   Neurological:  Negative for dizziness and light-headedness.  Hematological:  Negative for adenopathy. Does not bruise/bleed easily.  Psychiatric/Behavioral:  Negative for dysphoric mood and sleep disturbance. The patient is nervous/anxious.    Vitals:   04/06/21 1413  BP: 126/65  Pulse: 71  Resp: 16  SpO2: 98%  Weight: 128 lb (58.1 kg)  Height: '5\' 5"'$  (1.651 m)   Wt Readings from Last 3 Encounters:  04/06/21 128 lb (58.1 kg)  03/13/21 150 lb (68 kg)  05/21/20 125 lb 14.4 oz (57.1 kg)   Lab Results  Component Value Date   CREATININE 0.87 03/13/2021   CREATININE 1.10 (H) 05/22/2020   CREATININE 0.82 05/20/2020    Physical Exam Vitals and nursing note reviewed. Exam conducted with a chaperone present (daughter).  Constitutional:      Appearance: Normal appearance.  HENT:     Head: Normocephalic  and atraumatic.  Cardiovascular:     Rate and Rhythm: Normal rate and regular rhythm.  Pulmonary:     Effort: Pulmonary effort is normal. No respiratory distress.     Breath sounds: No wheezing or rales.  Abdominal:     General: There is no distension.     Palpations: Abdomen is soft.     Tenderness: There is no abdominal tenderness.  Musculoskeletal:        General: No tenderness.     Cervical back: Normal range of motion and neck supple.     Right lower leg: No edema.     Left lower leg: No edema.  Skin:    General: Skin is warm and dry.  Neurological:     General: No focal deficit present.     Mental Status: She is alert and oriented to person, place, and time.  Psychiatric:        Mood and Affect: Mood is anxious.        Behavior: Behavior normal.        Thought Content: Thought content normal.    Assessment & Plan:  1: Chronic heart failure with preserved ejection fraction with structural changes (LAE)- - NYHA class II - euvolemic today - not  weighing daily but has scales; instructed to weight daily and call for an overnight weight gain of > 2 pounds or a weekly weight gain of > 5 pounds - not adding salt and has been looking at food labels for sodium content; low sodium cookbook given to her - advised to call Dr Saralyn Pilar office to schedule follow-up - consider changing lisinopril to entresto and adding jardiance - discussed the importance of taking her meds at consistent times; either breakfast/ supper or lunch/bedtime - BNP 03/13/21 was 787.2  2: HTN- - BP looks good today - saw PCP Kary Kos) 03/16/21 - BMP 03/13/21 reviewed and showed sodium 136, potassium 3.8, creatinine 0.87 and GFR >60  3: DM- - A1c 03/16/21 was 7.8%  4: CAD- - stent placed 09/21 - was on brilinta and then stopped it; resumed it during recent ED visit - she is to call cardiology and schedule f/u appointment  5: Anxiety- - saw psychiatry Thurmond Butts) 02/17/21   Reviewed medication bottles  Return in 1 month or sooner for any questions/problems before then.

## 2021-04-06 ENCOUNTER — Other Ambulatory Visit: Payer: Self-pay

## 2021-04-06 ENCOUNTER — Ambulatory Visit: Payer: PPO | Attending: Family | Admitting: Family

## 2021-04-06 ENCOUNTER — Encounter: Payer: Self-pay | Admitting: Family

## 2021-04-06 VITALS — BP 126/65 | HR 71 | Resp 16 | Ht 65.0 in | Wt 128.0 lb

## 2021-04-06 DIAGNOSIS — Z955 Presence of coronary angioplasty implant and graft: Secondary | ICD-10-CM | POA: Insufficient documentation

## 2021-04-06 DIAGNOSIS — Z880 Allergy status to penicillin: Secondary | ICD-10-CM | POA: Insufficient documentation

## 2021-04-06 DIAGNOSIS — I10 Essential (primary) hypertension: Secondary | ICD-10-CM

## 2021-04-06 DIAGNOSIS — Z882 Allergy status to sulfonamides status: Secondary | ICD-10-CM | POA: Insufficient documentation

## 2021-04-06 DIAGNOSIS — E119 Type 2 diabetes mellitus without complications: Secondary | ICD-10-CM | POA: Insufficient documentation

## 2021-04-06 DIAGNOSIS — E785 Hyperlipidemia, unspecified: Secondary | ICD-10-CM | POA: Insufficient documentation

## 2021-04-06 DIAGNOSIS — I110 Hypertensive heart disease with heart failure: Secondary | ICD-10-CM | POA: Diagnosis not present

## 2021-04-06 DIAGNOSIS — Z886 Allergy status to analgesic agent status: Secondary | ICD-10-CM | POA: Insufficient documentation

## 2021-04-06 DIAGNOSIS — I252 Old myocardial infarction: Secondary | ICD-10-CM | POA: Insufficient documentation

## 2021-04-06 DIAGNOSIS — I5032 Chronic diastolic (congestive) heart failure: Secondary | ICD-10-CM | POA: Insufficient documentation

## 2021-04-06 DIAGNOSIS — F32A Depression, unspecified: Secondary | ICD-10-CM | POA: Diagnosis not present

## 2021-04-06 DIAGNOSIS — Z7982 Long term (current) use of aspirin: Secondary | ICD-10-CM | POA: Insufficient documentation

## 2021-04-06 DIAGNOSIS — Z888 Allergy status to other drugs, medicaments and biological substances status: Secondary | ICD-10-CM | POA: Diagnosis not present

## 2021-04-06 DIAGNOSIS — Z79899 Other long term (current) drug therapy: Secondary | ICD-10-CM | POA: Insufficient documentation

## 2021-04-06 DIAGNOSIS — F419 Anxiety disorder, unspecified: Secondary | ICD-10-CM | POA: Diagnosis not present

## 2021-04-06 DIAGNOSIS — K219 Gastro-esophageal reflux disease without esophagitis: Secondary | ICD-10-CM | POA: Insufficient documentation

## 2021-04-06 DIAGNOSIS — Z885 Allergy status to narcotic agent status: Secondary | ICD-10-CM | POA: Diagnosis not present

## 2021-04-06 DIAGNOSIS — I2510 Atherosclerotic heart disease of native coronary artery without angina pectoris: Secondary | ICD-10-CM | POA: Diagnosis not present

## 2021-04-06 NOTE — Patient Instructions (Addendum)
Begin weighing daily and call for an overnight weight gain of > 2 pounds or a weekly weight gain of >5 pounds.    Drink about 60 ounces of fluids daily   Call Dr. Saralyn Pilar office at [phone]

## 2021-04-10 ENCOUNTER — Telehealth: Payer: Self-pay | Admitting: Family

## 2021-04-10 NOTE — Telephone Encounter (Signed)
Patient called on advice from our NP [facility] regarding a consistant nose bleed she has been having and advice on what to do. After speaking to Otila Kluver, we advised patient to call her primary care or one of the previous cardiologst she has seen to have them potientally change a blood thinner.   Calab Sachse, NT

## 2021-05-09 NOTE — Progress Notes (Addendum)
Patient ID: Emily Jones, female    DOB: February 10, 1941, 80 y.o.   MRN: [id number]  HPI  Emily Jones is a 80 y/o female with a history of CAD, DM, hyperlipidemia, HTN, anxiety, depression, GERD, panic attacks and chronic heart failure.   Echo report from 05/20/20 reviewed and showed an EF of 50-55% along with mild LAE and moderate MR.   LHC done 05/20/20 showed: 1st Mrg lesion is 100% stenosed. Mid Cx lesion is 95% stenosed. Ost Cx to Prox Cx lesion is 90% stenosed. Mid LAD lesion is 50% stenosed. Mid LAD to Dist LAD lesion is 40% stenosed. Prox RCA lesion is 30% stenosed. Dist RCA lesion is 40% stenosed. RPAV lesion is 50% stenosed. A drug-eluting stent was successfully placed using a STENT RESOLUTE ONYX 2.0X15. Post intervention, there is a 0% residual stenosis. A drug-eluting stent was successfully placed using a [employer] [id number]. Post intervention, there is a 0% residual stenosis. A drug-eluting stent was successfully placed using a [place] [id number]. Post intervention, there is a 0% residual stenosis.   1.  Inferior STEMI 2.  Acute occlusion of OM1 with high-grade 95% stenosis mid left circumflex, and ulcerated 90% stenosis proximal left circumflex 3.  Mildly reduced left ventricular function, with estimated LVEF 45 to 50%, with inferoapical hypokinesis 4.  Successful primary PCI with DES OM1, mid left circumflex, and proximal left circumflex  Was in the ED 03/13/21 due to shortness of breath. Ran out of brilinta a few weeks ago and stopped her BP medications. CTA without PE. Compliance enforced and she was released.    She presents today for a follow-up visit with a chief complaint of abdominal distention. She describes this as occurring on an intermittent basis. She has associated anxiety along with this. She denies any difficulty sleeping, dizziness, abdominal pain, palpitations, pedal edema, chest pain, shortness of breath, cough, fatigue or weight gain.    Drinking 1 boost daily.   Is unsure if she's been out of metoprolol or if she has another bottle at home.   She says that she's trying to walk but sometimes tires easily and is concerned that if she's out walking with her walker and gets tired or weak, that she may fall if she can't quickly find a place to sit down. She feels like if she could get a rolling walker that has a seat on it, it would allow her to walk more because then she could sit right down if she felt a need. Right now, she just uses her walker.   Past Medical History:  Diagnosis Date   Allergic state    Anxiety    CHF (congestive heart failure) ([place])    Coronary artery disease    Costochondritis    Depression    Diabetes mellitus without complication ([place])    Esophagitis    GERD (gastroesophageal reflux disease)    Headache    Hemorrhoids    Hyperlipidemia    Hypertension    Panic attacks    Past Surgical History:  Procedure Laterality Date   APPENDECTOMY     COLONOSCOPY N/A 03/29/2017   Procedure: COLONOSCOPY;  Surgeon: Manya Silvas, MD;  Location: [facility] ENDOSCOPY;  Service: Endoscopy;  Laterality: N/A;   CORONARY/GRAFT ACUTE MI REVASCULARIZATION N/A 05/20/2020   Procedure: Coronary/Graft Acute MI Revascularization;  Surgeon: Isaias Cowman, MD;  Location: [place] [facility] LAB;  Service: Cardiovascular;  Laterality: N/A;   DILATION AND CURETTAGE, DIAGNOSTIC / THERAPEUTIC  ESOPHAGOGASTRODUODENOSCOPY (EGD) WITH PROPOFOL N/A 03/29/2017   Procedure: ESOPHAGOGASTRODUODENOSCOPY (EGD) WITH PROPOFOL;  Surgeon: Manya Silvas, MD;  Location: [facility] ENDOSCOPY;  Service: Endoscopy;  Laterality: N/A;   KNEE SURGERY     LEFT HEART CATH AND CORONARY ANGIOGRAPHY N/A 05/20/2020   Procedure: LEFT HEART CATH AND CORONARY ANGIOGRAPHY;  Surgeon: Isaias Cowman, MD;  Location: [place] CV LAB;  Service: Cardiovascular;  Laterality: N/A;   No family history on file. Social History   Tobacco Use   Smoking  status: Never   Smokeless tobacco: Never  Substance Use Topics   Alcohol use: No   Allergies  Allergen Reactions   Aspirin    Chlorpheniramine Maleate    Codeine    Statins Other (See Comments)    Muscle Spasms   Sulfa Antibiotics    Penicillins Rash   Prior to Admission medications   Medication Sig Start Date End Date Taking? Authorizing Provider  acetaminophen (TYLENOL) 325 MG tablet Take by mouth every 6 (six) hours as needed.   Yes [provider]  aspirin 81 MG chewable tablet Chew 1 tablet (81 mg total) by mouth daily. 03/13/21  Yes Vallarie Mare M, PA-C  atorvastatin (LIPITOR) 80 MG tablet Take 1 tablet (80 mg total) by mouth daily. 03/13/21  Yes Vallarie Mare M, PA-C  clonazePAM (KLONOPIN) 0.5 MG tablet Take 0.25 mg by mouth daily.   Yes [provider]  lisinopril (ZESTRIL) 2.5 MG tablet Take 1 tablet (2.5 mg total) by mouth daily. 03/13/21  Yes Vallarie Mare M, PA-C  nitroGLYCERIN (NITROSTAT) 0.4 MG SL tablet Place 1 tablet (0.4 mg total) under the tongue every 5 (five) minutes as needed for chest pain. 05/22/20  Yes Lorella Nimrod, MD  omeprazole (PRILOSEC) 20 MG capsule Take 20 mg by mouth daily.   Yes [provider]  sertraline (ZOLOFT) 100 MG tablet Take 100 mg by mouth daily.   Yes [provider]  ticagrelor (BRILINTA) 90 MG TABS tablet Take 1 tablet (90 mg total) by mouth 2 (two) times daily. 03/13/21  Yes Vallarie Mare M, PA-C  metoprolol tartrate (LOPRESSOR) 25 MG tablet Take 1 tablet (25 mg total) by mouth 2 (two) times daily. Patient not taking: Reported on 05/10/2021 03/13/21   Lannie Fields, PA-C   Review of Systems  Constitutional:  Negative for appetite change and fatigue.  HENT:  Negative for congestion and sore throat.   Eyes: Negative.   Respiratory:  Negative for cough and shortness of breath.   Cardiovascular:  Negative for chest pain, palpitations and leg swelling.  Gastrointestinal:  Positive for abdominal distention  (at times). Negative for abdominal pain.  Endocrine: Negative.   Genitourinary: Negative.   Musculoskeletal:  Negative for back pain and neck pain.  Skin: Negative.   Allergic/Immunologic: Negative.   Neurological:  Negative for dizziness and light-headedness.  Hematological:  Negative for adenopathy. Does not bruise/bleed easily.  Psychiatric/Behavioral:  Negative for dysphoric mood and sleep disturbance (sleeping on 1 pillow). The patient is nervous/anxious.    Vitals:   05/10/21 1400  BP: (!) 141/80  Pulse: 93  Resp: 18  SpO2: 98%  Weight: 130 lb 2 oz (59 kg)  Height: '5\' 5"'$  (1.651 m)   Wt Readings from Last 3 Encounters:  05/10/21 130 lb 2 oz (59 kg)  04/06/21 128 lb (58.1 kg)  03/13/21 150 lb (68 kg)   Lab Results  Component Value Date   CREATININE 0.87 03/13/2021   CREATININE 1.10 (H) 05/22/2020  CREATININE 0.82 05/20/2020    Physical Exam Vitals and nursing note reviewed. Exam conducted with a chaperone present (friend).  Constitutional:      Appearance: Normal appearance.  HENT:     Head: Normocephalic and atraumatic.  Cardiovascular:     Rate and Rhythm: Normal rate and regular rhythm.  Pulmonary:     Effort: Pulmonary effort is normal. No respiratory distress.     Breath sounds: No wheezing or rales.  Abdominal:     General: There is no distension.     Palpations: Abdomen is soft.     Tenderness: There is no abdominal tenderness.  Musculoskeletal:        General: No tenderness.     Cervical back: Normal range of motion and neck supple.     Right lower leg: No edema.     Left lower leg: No edema.  Skin:    General: Skin is warm and dry.  Neurological:     General: No focal deficit present.     Mental Status: She is alert and oriented to person, place, and time.  Psychiatric:        Mood and Affect: Mood is anxious.        Behavior: Behavior normal.        Thought Content: Thought content normal.    Assessment & Plan:  1: Chronic heart failure  with preserved ejection fraction with structural changes (LAE)- - NYHA class I - euvolemic today - weighing daily; reminded to call for an overnight weight gain of > 2 pounds or a weekly weight gain of > 5 pounds - weight up 2 pounds from last visit here 1 month ago - not adding salt and has been looking at food labels for sodium content - advised to call Dr Saralyn Pilar office to schedule follow-up & office # provided to her - consider changing lisinopril to entresto  - will add jardiance '10mg'$  daily; 14 day voucher provided to her along with 1 month of samples - will check BMP next visit if not done elsewhere - RX provided for rolling walker with seat - BNP 03/13/21 was 787.2  2: HTN- - BP looks good today (141/80) - saw PCP Kary Kos) 03/16/21 - BMP 03/13/21 reviewed and showed sodium 136, potassium 3.8, creatinine 0.87 and GFR >60  3: DM- - A1c 03/16/21 was 7.8%  4: CAD- - stent placed 09/21 - continues on brilinta - she is to call cardiology and schedule f/u appointment  5: Anxiety- - saw psychiatry Thurmond Butts) 02/17/21   Reviewed medication bottles  Return in 1 month or sooner for any questions/problems before then.

## 2021-05-10 ENCOUNTER — Encounter: Payer: Self-pay | Admitting: Family

## 2021-05-10 ENCOUNTER — Other Ambulatory Visit: Payer: Self-pay

## 2021-05-10 ENCOUNTER — Ambulatory Visit: Payer: PPO | Attending: Family | Admitting: Family

## 2021-05-10 VITALS — BP 141/80 | HR 93 | Resp 18 | Ht 65.0 in | Wt 130.1 lb

## 2021-05-10 DIAGNOSIS — Z885 Allergy status to narcotic agent status: Secondary | ICD-10-CM | POA: Insufficient documentation

## 2021-05-10 DIAGNOSIS — F419 Anxiety disorder, unspecified: Secondary | ICD-10-CM | POA: Insufficient documentation

## 2021-05-10 DIAGNOSIS — Z7902 Long term (current) use of antithrombotics/antiplatelets: Secondary | ICD-10-CM | POA: Insufficient documentation

## 2021-05-10 DIAGNOSIS — I5032 Chronic diastolic (congestive) heart failure: Secondary | ICD-10-CM

## 2021-05-10 DIAGNOSIS — Z955 Presence of coronary angioplasty implant and graft: Secondary | ICD-10-CM | POA: Insufficient documentation

## 2021-05-10 DIAGNOSIS — I2510 Atherosclerotic heart disease of native coronary artery without angina pectoris: Secondary | ICD-10-CM

## 2021-05-10 DIAGNOSIS — E119 Type 2 diabetes mellitus without complications: Secondary | ICD-10-CM

## 2021-05-10 DIAGNOSIS — R140 Abdominal distension (gaseous): Secondary | ICD-10-CM | POA: Diagnosis not present

## 2021-05-10 DIAGNOSIS — Z880 Allergy status to penicillin: Secondary | ICD-10-CM | POA: Diagnosis not present

## 2021-05-10 DIAGNOSIS — Z886 Allergy status to analgesic agent status: Secondary | ICD-10-CM | POA: Insufficient documentation

## 2021-05-10 DIAGNOSIS — Z9049 Acquired absence of other specified parts of digestive tract: Secondary | ICD-10-CM | POA: Insufficient documentation

## 2021-05-10 DIAGNOSIS — Z882 Allergy status to sulfonamides status: Secondary | ICD-10-CM | POA: Insufficient documentation

## 2021-05-10 DIAGNOSIS — Z888 Allergy status to other drugs, medicaments and biological substances status: Secondary | ICD-10-CM | POA: Insufficient documentation

## 2021-05-10 DIAGNOSIS — I110 Hypertensive heart disease with heart failure: Secondary | ICD-10-CM | POA: Diagnosis not present

## 2021-05-10 DIAGNOSIS — I10 Essential (primary) hypertension: Secondary | ICD-10-CM

## 2021-05-10 MED ORDER — METOPROLOL TARTRATE 25 MG PO TABS: 25.0000 mg | ORAL_TABLET | Freq: Two times a day (BID) | ORAL | 5 refills | Status: DC

## 2021-05-10 MED ORDER — EMPAGLIFLOZIN 10 MG PO TABS: 10.0000 mg | ORAL_TABLET | Freq: Every day | ORAL | 0 refills | Status: DC

## 2021-05-10 NOTE — Patient Instructions (Addendum)
Continue weighing daily and call for an overnight weight gain of > 2 pounds or a weekly weight gain of >5 pounds.    Call Dr. Saralyn Pilar (heart doctor) to schedule appointment at [phone]   Begin jardiance '10mg'$  once a day

## 2021-06-09 ENCOUNTER — Other Ambulatory Visit: Payer: Self-pay

## 2021-06-09 ENCOUNTER — Encounter: Payer: Self-pay | Admitting: Family

## 2021-06-09 ENCOUNTER — Ambulatory Visit: Payer: PPO | Attending: Family | Admitting: Family

## 2021-06-09 VITALS — BP 149/61 | HR 95 | Resp 16 | Ht 65.0 in | Wt 132.0 lb

## 2021-06-09 DIAGNOSIS — Z880 Allergy status to penicillin: Secondary | ICD-10-CM | POA: Diagnosis not present

## 2021-06-09 DIAGNOSIS — Z882 Allergy status to sulfonamides status: Secondary | ICD-10-CM | POA: Insufficient documentation

## 2021-06-09 DIAGNOSIS — Z7982 Long term (current) use of aspirin: Secondary | ICD-10-CM | POA: Insufficient documentation

## 2021-06-09 DIAGNOSIS — I110 Hypertensive heart disease with heart failure: Secondary | ICD-10-CM | POA: Insufficient documentation

## 2021-06-09 DIAGNOSIS — I5032 Chronic diastolic (congestive) heart failure: Secondary | ICD-10-CM | POA: Diagnosis not present

## 2021-06-09 DIAGNOSIS — E1165 Type 2 diabetes mellitus with hyperglycemia: Secondary | ICD-10-CM | POA: Diagnosis not present

## 2021-06-09 DIAGNOSIS — E785 Hyperlipidemia, unspecified: Secondary | ICD-10-CM | POA: Insufficient documentation

## 2021-06-09 DIAGNOSIS — E119 Type 2 diabetes mellitus without complications: Secondary | ICD-10-CM | POA: Diagnosis not present

## 2021-06-09 DIAGNOSIS — R531 Weakness: Secondary | ICD-10-CM | POA: Diagnosis not present

## 2021-06-09 DIAGNOSIS — I2510 Atherosclerotic heart disease of native coronary artery without angina pectoris: Secondary | ICD-10-CM | POA: Diagnosis not present

## 2021-06-09 DIAGNOSIS — Z886 Allergy status to analgesic agent status: Secondary | ICD-10-CM | POA: Diagnosis not present

## 2021-06-09 DIAGNOSIS — Z888 Allergy status to other drugs, medicaments and biological substances status: Secondary | ICD-10-CM | POA: Insufficient documentation

## 2021-06-09 DIAGNOSIS — Z79899 Other long term (current) drug therapy: Secondary | ICD-10-CM | POA: Insufficient documentation

## 2021-06-09 DIAGNOSIS — I10 Essential (primary) hypertension: Secondary | ICD-10-CM | POA: Diagnosis not present

## 2021-06-09 DIAGNOSIS — F419 Anxiety disorder, unspecified: Secondary | ICD-10-CM

## 2021-06-09 DIAGNOSIS — Z885 Allergy status to narcotic agent status: Secondary | ICD-10-CM | POA: Insufficient documentation

## 2021-06-09 DIAGNOSIS — Z955 Presence of coronary angioplasty implant and graft: Secondary | ICD-10-CM | POA: Diagnosis not present

## 2021-06-09 LAB — GLUCOSE, CAPILLARY: Glucose-Capillary: 468 mg/dL — ABNORMAL HIGH (ref 70–99)

## 2021-06-09 MED ORDER — ASPIRIN 81 MG PO CHEW: 81.0000 mg | CHEWABLE_TABLET | Freq: Every day | ORAL | 3 refills | Status: DC

## 2021-06-09 MED ORDER — ATORVASTATIN CALCIUM 80 MG PO TABS: 80.0000 mg | ORAL_TABLET | Freq: Every day | ORAL | 3 refills | Status: DC

## 2021-06-09 MED ORDER — LISINOPRIL 2.5 MG PO TABS: 2.5000 mg | ORAL_TABLET | Freq: Every day | ORAL | 3 refills | Status: DC

## 2021-06-09 NOTE — Patient Instructions (Addendum)
Continue weighing daily and call for an overnight weight gain of > 2 pounds or a weekly weight gain of >5 pounds.    You see Dr. Kary Kos on Thursday Oct 20th at 3:30pm  [address], [place], [place] [id number] [phone]  Ask about your diabetes &  getting a Glucometer to start checking blood sugars and how to do it!

## 2021-06-09 NOTE — Progress Notes (Signed)
Patient ID: Emily Jones, female    DOB: 1941-06-29, 80 y.o.   MRN: [id number]  HPI  Emily Jones is a 80 y/o female with a history of CAD, DM, hyperlipidemia, HTN, anxiety, depression, GERD, panic attacks and chronic heart failure.   Echo report from 05/20/20 reviewed and showed an EF of 50-55% along with mild LAE and moderate MR.   LHC done 05/20/20 showed: 1st Mrg lesion is 100% stenosed. Mid Cx lesion is 95% stenosed. Ost Cx to Prox Cx lesion is 90% stenosed. Mid LAD lesion is 50% stenosed. Mid LAD to Dist LAD lesion is 40% stenosed. Prox RCA lesion is 30% stenosed. Dist RCA lesion is 40% stenosed. RPAV lesion is 50% stenosed. A drug-eluting stent was successfully placed using a STENT RESOLUTE ONYX 2.0X15. Post intervention, there is a 0% residual stenosis. A drug-eluting stent was successfully placed using a [place] 2.87O67. Post intervention, there is a 0% residual stenosis. A drug-eluting stent was successfully placed using a [place] [id number]. Post intervention, there is a 0% residual stenosis.   1.  Inferior STEMI 2.  Acute occlusion of OM1 with high-grade 95% stenosis mid left circumflex, and ulcerated 90% stenosis proximal left circumflex 3.  Mildly reduced left ventricular function, with estimated LVEF 45 to 50%, with inferoapical hypokinesis 4.  Successful primary PCI with DES OM1, mid left circumflex, and proximal left circumflex  Was in the ED 03/13/21 due to shortness of breath. Ran out of brilinta a few weeks ago and stopped her BP medications. CTA without PE. Compliance enforced and she was released.    She presents today for a follow-up visit with a chief complaint of minimal shortness of breath upon moderate exertion. She says this has been present for several years. She has also anxiety along with this. She denies any difficulty sleeping, dizziness, fatigue, cough, chest pain, pedal edema, palpitations, abdominal distention or weight gain.   She  says that she didn't start the Jardiance because the pharmacy told her it would cost her hundreds of dollars.   She says that she's been out of lisinopril and atorvastatin for "maybe 1-2 months" but she's uncertain of time frame.   Past Medical History:  Diagnosis Date   Allergic state    Anxiety    CHF (congestive heart failure) ([place])    Coronary artery disease    Costochondritis    Depression    Diabetes mellitus without complication ([place])    Esophagitis    GERD (gastroesophageal reflux disease)    Headache    Hemorrhoids    Hyperlipidemia    Hypertension    Panic attacks    Past Surgical History:  Procedure Laterality Date   APPENDECTOMY     COLONOSCOPY N/A 03/29/2017   Procedure: COLONOSCOPY;  Surgeon: Manya Silvas, MD;  Location: [facility] ENDOSCOPY;  Service: Endoscopy;  Laterality: N/A;   CORONARY/GRAFT ACUTE MI REVASCULARIZATION N/A 05/20/2020   Procedure: Coronary/Graft Acute MI Revascularization;  Surgeon: Isaias Cowman, MD;  Location: [place] CV LAB;  Service: Cardiovascular;  Laterality: N/A;   DILATION AND CURETTAGE, DIAGNOSTIC / THERAPEUTIC     ESOPHAGOGASTRODUODENOSCOPY (EGD) WITH PROPOFOL N/A 03/29/2017   Procedure: ESOPHAGOGASTRODUODENOSCOPY (EGD) WITH PROPOFOL;  Surgeon: Manya Silvas, MD;  Location: [facility] ENDOSCOPY;  Service: Endoscopy;  Laterality: N/A;   KNEE SURGERY     LEFT HEART CATH AND CORONARY ANGIOGRAPHY N/A 05/20/2020   Procedure: LEFT HEART CATH AND CORONARY ANGIOGRAPHY;  Surgeon: Isaias Cowman, MD;  Location: [place]  CV LAB;  Service: Cardiovascular;  Laterality: N/A;   No family history on file. Social History   Tobacco Use   Smoking status: Never   Smokeless tobacco: Never  Substance Use Topics   Alcohol use: No   Allergies  Allergen Reactions   Aspirin    Chlorpheniramine Maleate    Codeine    Statins Other (See Comments)    Muscle Spasms   Sulfa Antibiotics    Penicillins Rash   Prior to Admission  medications   Medication Sig Start Date End Date Taking? Authorizing Provider  acetaminophen (TYLENOL) 325 MG tablet Take by mouth every 6 (six) hours as needed.   Yes [provider]  aspirin 81 MG chewable tablet Chew 1 tablet (81 mg total) by mouth daily. 03/13/21  Yes Vallarie Mare M, PA-C  clonazePAM (KLONOPIN) 0.5 MG tablet Take 0.25 mg by mouth daily.   Yes [provider]  metoprolol tartrate (LOPRESSOR) 25 MG tablet Take 1 tablet (25 mg total) by mouth 2 (two) times daily. 05/10/21  Yes Jamesetta Greenhalgh, Otila Kluver A, FNP  nitroGLYCERIN (NITROSTAT) 0.4 MG SL tablet Place 1 tablet (0.4 mg total) under the tongue every 5 (five) minutes as needed for chest pain. 05/22/20  Yes Lorella Nimrod, MD  omeprazole (PRILOSEC) 20 MG capsule Take 20 mg by mouth daily.   Yes [provider]  sertraline (ZOLOFT) 100 MG tablet Take 100 mg by mouth daily.   Yes [provider]  ticagrelor (BRILINTA) 90 MG TABS tablet Take 1 tablet (90 mg total) by mouth 2 (two) times daily. 03/13/21  Yes Vallarie Mare M, PA-C  atorvastatin (LIPITOR) 80 MG tablet Take 1 tablet (80 mg total) by mouth daily. Patient not taking: Reported on 06/09/2021 03/13/21   Vallarie Mare M, PA-C  empagliflozin (JARDIANCE) 10 MG TABS tablet Take 1 tablet (10 mg total) by mouth daily before breakfast. Patient not taking: Reported on 06/09/2021 05/10/21   Alisa Graff, FNP  lisinopril (ZESTRIL) 2.5 MG tablet Take 1 tablet (2.5 mg total) by mouth daily. Patient not taking: Reported on 06/09/2021 03/13/21   Lannie Fields, PA-C    Review of Systems  Constitutional:  Negative for appetite change and fatigue.  HENT:  Negative for congestion and sore throat.   Eyes: Negative.   Respiratory:  Positive for shortness of breath ("very little"). Negative for cough.   Cardiovascular:  Negative for chest pain, palpitations and leg swelling.  Gastrointestinal:  Negative for abdominal distention and abdominal pain.  Endocrine:  Negative.   Genitourinary: Negative.   Musculoskeletal:  Negative for back pain and neck pain.  Skin: Negative.   Allergic/Immunologic: Negative.   Neurological:  Negative for dizziness and light-headedness.  Hematological:  Negative for adenopathy. Does not bruise/bleed easily.  Psychiatric/Behavioral:  Negative for dysphoric mood and sleep disturbance (sleeping on 1 pillow). The patient is nervous/anxious.    Vitals:   06/09/21 1419  BP: (!) 149/61  Pulse: 95  Resp: 16  SpO2: 100%  Weight: 132 lb (59.9 kg)  Height: 5\' 5"  (1.651 m)   Wt Readings from Last 3 Encounters:  06/09/21 132 lb (59.9 kg)  05/10/21 130 lb 2 oz (59 kg)  04/06/21 128 lb (58.1 kg)   Lab Results  Component Value Date   CREATININE 0.87 03/13/2021   CREATININE 1.10 (H) 05/22/2020   CREATININE 0.82 05/20/2020    Physical Exam Vitals and nursing note reviewed. Exam conducted with a chaperone present (friend).  Constitutional:  Appearance: Normal appearance.  HENT:     Head: Normocephalic and atraumatic.  Cardiovascular:     Rate and Rhythm: Normal rate and regular rhythm.  Pulmonary:     Effort: Pulmonary effort is normal. No respiratory distress.     Breath sounds: No wheezing or rales.  Abdominal:     General: There is no distension.     Palpations: Abdomen is soft.     Tenderness: There is no abdominal tenderness.  Musculoskeletal:        General: No tenderness.     Cervical back: Normal range of motion and neck supple.     Right lower leg: No edema.     Left lower leg: No edema.  Skin:    General: Skin is warm and dry.  Neurological:     General: No focal deficit present.     Mental Status: She is alert and oriented to person, place, and time.  Psychiatric:        Mood and Affect: Mood is anxious.        Behavior: Behavior normal.        Thought Content: Thought content normal.    Assessment & Plan:  1: Chronic heart failure with preserved ejection fraction with structural  changes (LAE)- - NYHA class II - euvolemic today - weighing daily; reminded to call for an overnight weight gain of > 2 pounds or a weekly weight gain of > 5 pounds - weight up 2 pounds from last visit here 1 month ago - not adding salt and has been looking at food labels for sodium content - advised to call Dr Saralyn Pilar office to schedule follow-up & office # provided to her - she did not start jardiance due to pharmacy telling her it would cost her hundreds of dollars; explained we could apply for patient assistance from the company but will hold off now due to need to get her diabetes under control - consider changing lisinopril to entresto but will hold off until all her meds get stabilized as she's already confused about her meds - BNP 03/13/21 was 787.2  2: HTN- - BP mildly elevated today but she's been out of lisinopril "for awhile"; refilled today - saw PCP Kary Kos) 03/16/21; returns 06/15/21 - BMP 03/13/21 reviewed and showed sodium 136, potassium 3.8, creatinine 0.87 and GFR >60  3: DM- - A1c 03/16/21 was 7.8% - nonfasting glucose today was 468; she says that she's been drinking mostly regular tea and Dr. Krystal Clark ([employer] brand of Dr. Malachi Bonds) - emphasized the importance of drinking more water instead and that she can not be drinking sugared tea or soda with her glucose being that high - doesn't have a glucometer; put on her AVS to ask for glucometer RX and instructions on how to use it - may benefit from insulin (although concerned about her ability to do this) so did not have patient start the jardiance at this time  4: CAD- - stent placed 09/21 - continues on brilinta - she needs to call cardiology and schedule f/u appointment  5: Anxiety- - saw psychiatry Thurmond Butts) 02/17/21   Reviewed medication bottles  Return in 2 months or sooner for any questions/problems before then.

## 2021-06-10 ENCOUNTER — Encounter: Payer: Self-pay | Admitting: Family

## 2021-06-13 ENCOUNTER — Telehealth: Payer: Self-pay | Admitting: Family

## 2021-06-13 NOTE — Telephone Encounter (Signed)
Faxed Lab results to PCP for uncomming appointment as patients Glucose was elevated.   Andreah Goheen, NT

## 2021-07-03 ENCOUNTER — Telehealth: Payer: Self-pay | Admitting: Family

## 2021-07-03 NOTE — Telephone Encounter (Signed)
Called and attempted to reach patient several times in response to her voicemail that she needs her brilinta refilled. We have attempted to reach her to advise patient she will have to get that refilled from her cardiologist but patients voicemail is full.   Terrick Allred, NT

## 2021-08-10 NOTE — Progress Notes (Deleted)
Patient ID: Emily Jones, female    DOB: 1940/10/23, 80 y.o.   MRN: [id number]  HPI  Emily Jones is a 80 y/o female with a history of CAD, DM, hyperlipidemia, HTN, anxiety, depression, GERD, panic attacks and chronic heart failure.   Echo report from 05/20/20 reviewed and showed an EF of 50-55% along with mild LAE and moderate MR.   LHC done 05/20/20 showed: 1st Mrg lesion is 100% stenosed. Mid Cx lesion is 95% stenosed. Ost Cx to Prox Cx lesion is 90% stenosed. Mid LAD lesion is 50% stenosed. Mid LAD to Dist LAD lesion is 40% stenosed. Prox RCA lesion is 30% stenosed. Dist RCA lesion is 40% stenosed. RPAV lesion is 50% stenosed. A drug-eluting stent was successfully placed using a STENT RESOLUTE ONYX 2.0X15. Post intervention, there is a 0% residual stenosis. A drug-eluting stent was successfully placed using a [place] 2.59D63. Post intervention, there is a 0% residual stenosis. A drug-eluting stent was successfully placed using a [place] [id number]. Post intervention, there is a 0% residual stenosis.   1.  Inferior STEMI 2.  Acute occlusion of OM1 with high-grade 95% stenosis mid left circumflex, and ulcerated 90% stenosis proximal left circumflex 3.  Mildly reduced left ventricular function, with estimated LVEF 45 to 50%, with inferoapical hypokinesis 4.  Successful primary PCI with DES OM1, mid left circumflex, and proximal left circumflex  Was in the ED 03/13/21 due to shortness of breath. Ran out of brilinta a few weeks ago and stopped her BP medications. CTA without PE. Compliance enforced and she was released.    She presents today for a follow-up visit with a chief complaint of   Past Medical History:  Diagnosis Date   Allergic state    Anxiety    CHF (congestive heart failure) ([place])    Coronary artery disease    Costochondritis    Depression    Diabetes mellitus without complication ([place])    Esophagitis    GERD (gastroesophageal reflux disease)     Headache    Hemorrhoids    Hyperlipidemia    Hypertension    Panic attacks    Past Surgical History:  Procedure Laterality Date   APPENDECTOMY     COLONOSCOPY N/A 03/29/2017   Procedure: COLONOSCOPY;  Surgeon: Manya Silvas, MD;  Location: [facility] ENDOSCOPY;  Service: Endoscopy;  Laterality: N/A;   CORONARY/GRAFT ACUTE MI REVASCULARIZATION N/A 05/20/2020   Procedure: Coronary/Graft Acute MI Revascularization;  Surgeon: Isaias Cowman, MD;  Location: [place] [facility] LAB;  Service: Cardiovascular;  Laterality: N/A;   DILATION AND CURETTAGE, DIAGNOSTIC / THERAPEUTIC     ESOPHAGOGASTRODUODENOSCOPY (EGD) WITH PROPOFOL N/A 03/29/2017   Procedure: ESOPHAGOGASTRODUODENOSCOPY (EGD) WITH PROPOFOL;  Surgeon: Manya Silvas, MD;  Location: [facility] ENDOSCOPY;  Service: Endoscopy;  Laterality: N/A;   KNEE SURGERY     LEFT HEART CATH AND CORONARY ANGIOGRAPHY N/A 05/20/2020   Procedure: LEFT HEART CATH AND CORONARY ANGIOGRAPHY;  Surgeon: Isaias Cowman, MD;  Location: [place] [facility] LAB;  Service: Cardiovascular;  Laterality: N/A;   No family history on file. Social History   Tobacco Use   Smoking status: Never   Smokeless tobacco: Never  Substance Use Topics   Alcohol use: No   Allergies  Allergen Reactions   Aspirin    Chlorpheniramine Maleate    Codeine    Statins Other (See Comments)    Muscle Spasms   Sulfa Antibiotics    Penicillins Rash     Review of  Systems  Constitutional:  Negative for appetite change and fatigue.  HENT:  Negative for congestion and sore throat.   Eyes: Negative.   Respiratory:  Positive for shortness of breath ("very little"). Negative for cough.   Cardiovascular:  Negative for chest pain, palpitations and leg swelling.  Gastrointestinal:  Negative for abdominal distention and abdominal pain.  Endocrine: Negative.   Genitourinary: Negative.   Musculoskeletal:  Negative for back pain and neck pain.  Skin: Negative.   Allergic/Immunologic:  Negative.   Neurological:  Negative for dizziness and light-headedness.  Hematological:  Negative for adenopathy. Does not bruise/bleed easily.  Psychiatric/Behavioral:  Negative for dysphoric mood and sleep disturbance (sleeping on 1 pillow). The patient is nervous/anxious.       Physical Exam Vitals and nursing note reviewed. Exam conducted with a chaperone present (friend).  Constitutional:      Appearance: Normal appearance.  HENT:     Head: Normocephalic and atraumatic.  Cardiovascular:     Rate and Rhythm: Normal rate and regular rhythm.  Pulmonary:     Effort: Pulmonary effort is normal. No respiratory distress.     Breath sounds: No wheezing or rales.  Abdominal:     General: There is no distension.     Palpations: Abdomen is soft.     Tenderness: There is no abdominal tenderness.  Musculoskeletal:        General: No tenderness.     Cervical back: Normal range of motion and neck supple.     Right lower leg: No edema.     Left lower leg: No edema.  Skin:    General: Skin is warm and dry.  Neurological:     General: No focal deficit present.     Mental Status: She is alert and oriented to person, place, and time.  Psychiatric:        Mood and Affect: Mood is anxious.        Behavior: Behavior normal.        Thought Content: Thought content normal.    Assessment & Plan:  1: Chronic heart failure with preserved ejection fraction with structural changes (LAE)- - NYHA class II - euvolemic today - weighing daily; reminded to call for an overnight weight gain of > 2 pounds or a weekly weight gain of > 5 pounds - weight 132 pounds from last visit here 2 months ago - not adding salt and has been looking at food labels for sodium content - advised to call Dr Saralyn Pilar office to schedule follow-up & office # provided to her - she did not start jardiance due to pharmacy telling her it would cost her hundreds of dollars; explained we could apply for patient assistance from  the company but will hold off now due to need to get her diabetes under control - consider changing lisinopril to entresto but will hold off until all her meds get stabilized as she's already confused about her meds - BNP 03/13/21 was 787.2  2: HTN- - BP  - saw PCP Kary Kos) 03/16/21; returns 06/15/21 - BMP 03/13/21 reviewed and showed sodium 136, potassium 3.8, creatinine 0.87 and GFR >60  3: DM- - A1c 03/16/21 was 7.8% - nonfasting glucose today was 468; she says that she's been drinking mostly regular tea and Dr. Krystal Clark ([employer] brand of Dr. Malachi Bonds) - emphasized the importance of drinking more water instead and that she can not be drinking sugared tea or soda with her glucose being that high - doesn't have a glucometer; put  on her AVS to ask for glucometer RX and instructions on how to use it  4: CAD- - stent placed 09/21 - continues on brilinta - she needs to call cardiology and schedule f/u appointment  5: Anxiety- - saw psychiatry Thurmond Butts) 02/17/21   Reviewed medication bottles

## 2021-08-11 ENCOUNTER — Telehealth: Payer: Self-pay | Admitting: Family

## 2021-08-11 ENCOUNTER — Ambulatory Visit: Payer: PPO | Admitting: Family

## 2021-08-11 NOTE — Telephone Encounter (Signed)
Patient did not show for her [facility] appointment on 08/11/21. Will attempt to reschedule.

## 2021-09-14 ENCOUNTER — Encounter: Payer: Self-pay | Admitting: Family

## 2021-09-14 ENCOUNTER — Ambulatory Visit: Payer: PPO | Attending: Family | Admitting: Family

## 2021-09-14 ENCOUNTER — Other Ambulatory Visit: Payer: Self-pay

## 2021-09-14 VITALS — BP 151/68 | HR 84 | Resp 16 | Ht 65.0 in | Wt 138.2 lb

## 2021-09-14 DIAGNOSIS — F32A Depression, unspecified: Secondary | ICD-10-CM | POA: Insufficient documentation

## 2021-09-14 DIAGNOSIS — E119 Type 2 diabetes mellitus without complications: Secondary | ICD-10-CM | POA: Insufficient documentation

## 2021-09-14 DIAGNOSIS — E785 Hyperlipidemia, unspecified: Secondary | ICD-10-CM | POA: Diagnosis not present

## 2021-09-14 DIAGNOSIS — I2510 Atherosclerotic heart disease of native coronary artery without angina pectoris: Secondary | ICD-10-CM | POA: Diagnosis not present

## 2021-09-14 DIAGNOSIS — I10 Essential (primary) hypertension: Secondary | ICD-10-CM | POA: Diagnosis not present

## 2021-09-14 DIAGNOSIS — I5032 Chronic diastolic (congestive) heart failure: Secondary | ICD-10-CM

## 2021-09-14 DIAGNOSIS — Z955 Presence of coronary angioplasty implant and graft: Secondary | ICD-10-CM | POA: Insufficient documentation

## 2021-09-14 DIAGNOSIS — F410 Panic disorder [episodic paroxysmal anxiety] without agoraphobia: Secondary | ICD-10-CM | POA: Diagnosis not present

## 2021-09-14 DIAGNOSIS — E1165 Type 2 diabetes mellitus with hyperglycemia: Secondary | ICD-10-CM

## 2021-09-14 DIAGNOSIS — I110 Hypertensive heart disease with heart failure: Secondary | ICD-10-CM | POA: Insufficient documentation

## 2021-09-14 DIAGNOSIS — F419 Anxiety disorder, unspecified: Secondary | ICD-10-CM | POA: Diagnosis not present

## 2021-09-14 DIAGNOSIS — K2100 Gastro-esophageal reflux disease with esophagitis, without bleeding: Secondary | ICD-10-CM | POA: Diagnosis not present

## 2021-09-14 NOTE — Patient Instructions (Addendum)
Continue weighing daily and call for an overnight weight gain of 3 pounds or more or a weekly weight gain of more than 5 pounds.    Call us in the future if you need [place] for anything   Call Dr Saralyn Pilar office (cardiology) at [phone]

## 2021-09-14 NOTE — Progress Notes (Signed)
Patient ID: Emily Jones, female    DOB: 08-20-1941, 81 y.o.   MRN: [id number]  HPI  Emily Jones is a 81 y/o female with a history of CAD, DM, hyperlipidemia, HTN, anxiety, depression, GERD, panic attacks and chronic heart failure.   Echo report from 05/20/20 reviewed and showed an EF of 50-55% along with mild LAE and moderate MR.   LHC done 05/20/20 showed: 1st Mrg lesion is 100% stenosed. Mid Cx lesion is 95% stenosed. Ost Cx to Prox Cx lesion is 90% stenosed. Mid LAD lesion is 50% stenosed. Mid LAD to Dist LAD lesion is 40% stenosed. Prox RCA lesion is 30% stenosed. Dist RCA lesion is 40% stenosed. RPAV lesion is 50% stenosed. A drug-eluting stent was successfully placed using a STENT RESOLUTE ONYX 2.0X15. Post intervention, there is a 0% residual stenosis. A drug-eluting stent was successfully placed using a [place] 3.22G25. Post intervention, there is a 0% residual stenosis. A drug-eluting stent was successfully placed using a [place] [id number]. Post intervention, there is a 0% residual stenosis.   1.  Inferior STEMI 2.  Acute occlusion of OM1 with high-grade 95% stenosis mid left circumflex, and ulcerated 90% stenosis proximal left circumflex 3.  Mildly reduced left ventricular function, with estimated LVEF 45 to 50%, with inferoapical hypokinesis 4.  Successful primary PCI with DES OM1, mid left circumflex, and proximal left circumflex  Was in the ED 03/13/21 due to shortness of breath. Ran out of brilinta a few weeks ago and stopped her BP medications. CTA without PE. Compliance enforced and Emily Jones was released.    Emily Jones presents today for a follow-up visit with a chief complaint of a dry cough. Emily Jones says that this has been chronic in nature having been present for several months. Emily Jones has associated intermittent chest pain, diarrhea and anxiety along with this. Emily Jones denies any dizziness, difficulty sleeping, abdominal distention, palpitations, pedal edema, shortness  of breath, fatigue or weight gain  Past Medical History:  Diagnosis Date   Allergic state    Anxiety    CHF (congestive heart failure) (HCC)    Coronary artery disease    Costochondritis    Depression    Diabetes mellitus without complication (HCC)    Esophagitis    GERD (gastroesophageal reflux disease)    Headache    Hemorrhoids    Hyperlipidemia    Hypertension    Panic attacks    Past Surgical History:  Procedure Laterality Date   APPENDECTOMY     COLONOSCOPY N/A 03/29/2017   Procedure: COLONOSCOPY;  Surgeon: Manya Silvas, MD;  Location: [facility] ENDOSCOPY;  Service: Endoscopy;  Laterality: N/A;   CORONARY/GRAFT ACUTE MI REVASCULARIZATION N/A 05/20/2020   Procedure: Coronary/Graft Acute MI Revascularization;  Surgeon: Isaias Cowman, MD;  Location: [place] [facility];  Service: Cardiovascular;  Laterality: N/A;   DILATION AND CURETTAGE, DIAGNOSTIC / THERAPEUTIC     ESOPHAGOGASTRODUODENOSCOPY (EGD) WITH PROPOFOL N/A 03/29/2017   Procedure: ESOPHAGOGASTRODUODENOSCOPY (EGD) WITH PROPOFOL;  Surgeon: Manya Silvas, MD;  Location: [facility] ENDOSCOPY;  Service: Endoscopy;  Laterality: N/A;   KNEE SURGERY     LEFT HEART CATH AND CORONARY ANGIOGRAPHY N/A 05/20/2020   Procedure: LEFT HEART CATH AND CORONARY ANGIOGRAPHY;  Surgeon: Isaias Cowman, MD;  Location: [place] [facility];  Service: Cardiovascular;  Laterality: N/A;   No family history on file. Social History   Tobacco Use   Smoking status: Never   Smokeless tobacco: Never  Substance Use Topics   Alcohol  use: No   Allergies  Allergen Reactions   Aspirin    Chlorpheniramine Maleate    Codeine    Statins Other (See Comments)    Muscle Spasms   Sulfa Antibiotics    Penicillins Rash   Prior to Admission medications   Medication Sig Start Date End Date Taking? Authorizing Provider  acetaminophen (TYLENOL) 325 MG tablet Take by mouth every 6 (six) hours as needed.   Yes [provider]  aspirin 81  MG chewable tablet Chew 1 tablet (81 mg total) by mouth daily. 06/09/21  Yes Zuriel Roskos, Otila Kluver A, FNP  atorvastatin (LIPITOR) 80 MG tablet Take 1 tablet (80 mg total) by mouth daily. 06/09/21  Yes Aadith Raudenbush A, FNP  clonazePAM (KLONOPIN) 0.5 MG tablet Take 0.25 mg by mouth daily.   Yes [provider]  lisinopril (ZESTRIL) 2.5 MG tablet Take 1 tablet (2.5 mg total) by mouth daily. 06/09/21  Yes Darylene Price A, FNP  metoprolol tartrate (LOPRESSOR) 25 MG tablet Take 1 tablet (25 mg total) by mouth 2 (two) times daily. 05/10/21  Yes Veta Dambrosia, Otila Kluver A, FNP  nitroGLYCERIN (NITROSTAT) 0.4 MG SL tablet Place 1 tablet (0.4 mg total) under the tongue every 5 (five) minutes as needed for chest pain. 05/22/20  Yes Lorella Nimrod, MD  omeprazole (PRILOSEC) 20 MG capsule Take 20 mg by mouth daily.   Yes [provider]  sertraline (ZOLOFT) 100 MG tablet Take 100 mg by mouth daily.   Yes [provider]  ticagrelor (BRILINTA) 90 MG TABS tablet Take 1 tablet (90 mg total) by mouth 2 (two) times daily. 03/13/21  Yes Vallarie Mare M, PA-C  empagliflozin (JARDIANCE) 10 MG TABS tablet Take 1 tablet (10 mg total) by mouth daily before breakfast. Patient not taking: Reported on 06/09/2021 05/10/21   Alisa Graff, FNP    Review of Systems  Constitutional:  Negative for appetite change and fatigue.  HENT:  Positive for sore throat (mostly at night). Negative for congestion and postnasal drip.   Eyes: Negative.   Respiratory:  Positive for cough (dry cough). Negative for shortness of breath.   Cardiovascular:  Positive for chest pain (intermittent). Negative for palpitations and leg swelling.  Gastrointestinal:  Positive for diarrhea. Negative for abdominal distention and abdominal pain.  Endocrine: Negative.   Genitourinary: Negative.   Musculoskeletal:  Negative for back pain and neck pain.  Skin: Negative.   Allergic/Immunologic: Negative.   Neurological:  Negative for dizziness and  light-headedness.  Hematological:  Negative for adenopathy. Does not bruise/bleed easily.  Psychiatric/Behavioral:  Negative for dysphoric mood and sleep disturbance (sleeping on 1 pillow). The patient is nervous/anxious.    Vitals:   09/14/21 1500  BP: (!) 151/68  Pulse: 84  Resp: 16  SpO2: 97%  Weight: 138 lb 4 oz (62.7 kg)  Height: 5\' 5"  (1.651 m)   Wt Readings from Last 3 Encounters:  09/14/21 138 lb 4 oz (62.7 kg)  06/09/21 132 lb (59.9 kg)  05/10/21 130 lb 2 oz (59 kg)   Lab Results  Component Value Date   CREATININE 0.87 03/13/2021   CREATININE 1.10 (H) 05/22/2020   CREATININE 0.82 05/20/2020   Physical Exam Vitals and nursing note reviewed. Exam conducted with a chaperone present (friend).  Constitutional:      Appearance: Normal appearance.  HENT:     Head: Normocephalic and atraumatic.  Cardiovascular:     Rate and Rhythm: Normal rate and regular rhythm.  Pulmonary:     Effort:  Pulmonary effort is normal. No respiratory distress.     Breath sounds: No wheezing or rales.  Abdominal:     General: There is no distension.     Palpations: Abdomen is soft.     Tenderness: There is no abdominal tenderness.  Musculoskeletal:        General: No tenderness.     Cervical back: Normal range of motion and neck supple.     Right lower leg: No edema.     Left lower leg: No edema.  Skin:    General: Skin is warm and dry.  Neurological:     General: No focal deficit present.     Mental Status: Emily Jones is alert and oriented to person, place, and time.  Psychiatric:        Mood and Affect: Mood is anxious.        Behavior: Behavior normal.        Thought Content: Thought content normal.    Assessment & Plan:  1: Chronic heart failure with preserved ejection fraction with structural changes (LAE)- - NYHA class I - euvolemic today - weighing daily; reminded to call for an overnight weight gain of > 2 pounds or a weekly weight gain of > 5 pounds - weight up 6 pounds from  last visit here 3 months ago - not adding salt and has been looking at food labels for sodium content - instructed, again, to call Dr Saralyn Pilar office to schedule follow-up & office # provided to her - prefers to not start any new medications at this time  - BNP 03/13/21 was 787.2  2: HTN- - BP mildly elevated (151/68) and says that Emily Jones gets nervous when coming to appointments - saw PCP Kary Kos) 03/16/21 - BMP 03/13/21 reviewed and showed sodium 136, potassium 3.8, creatinine 0.87 and GFR >60  3: DM- - A1c 03/16/21 was 7.8%  4: CAD- - stent placed 09/21 - continues on brilinta - Emily Jones needs to call cardiology and schedule f/u appointment  5: Anxiety- - saw psychiatry Thurmond Butts) 02/17/21   Reviewed medication bottles  Due to HF stability, will not make a return appointment for patient at this time. Advised patient and her friend that Emily Jones could call back at anytime for questions or problems and they were both comfortable with this plan.

## 2021-11-15 ENCOUNTER — Other Ambulatory Visit: Payer: Self-pay

## 2021-11-15 ENCOUNTER — Encounter: Payer: Self-pay | Admitting: Emergency Medicine

## 2021-11-15 ENCOUNTER — Emergency Department: Payer: PPO

## 2021-11-15 ENCOUNTER — Inpatient Hospital Stay
Admission: EM | Admit: 2021-11-15 | Discharge: 2021-11-17 | DRG: 291 | Disposition: A | Discharge status: Home Health | Payer: PPO | Attending: Internal Medicine | Admitting: Internal Medicine

## 2021-11-15 DIAGNOSIS — I2510 Atherosclerotic heart disease of native coronary artery without angina pectoris: Secondary | ICD-10-CM | POA: Diagnosis present

## 2021-11-15 DIAGNOSIS — F32A Depression, unspecified: Secondary | ICD-10-CM | POA: Diagnosis not present

## 2021-11-15 DIAGNOSIS — E1169 Type 2 diabetes mellitus with other specified complication: Secondary | ICD-10-CM | POA: Diagnosis present

## 2021-11-15 DIAGNOSIS — Z7982 Long term (current) use of aspirin: Secondary | ICD-10-CM | POA: Diagnosis not present

## 2021-11-15 DIAGNOSIS — I10 Essential (primary) hypertension: Secondary | ICD-10-CM | POA: Diagnosis present

## 2021-11-15 DIAGNOSIS — I252 Old myocardial infarction: Secondary | ICD-10-CM | POA: Diagnosis not present

## 2021-11-15 DIAGNOSIS — F410 Panic disorder [episodic paroxysmal anxiety] without agoraphobia: Secondary | ICD-10-CM | POA: Diagnosis present

## 2021-11-15 DIAGNOSIS — E782 Mixed hyperlipidemia: Secondary | ICD-10-CM | POA: Diagnosis not present

## 2021-11-15 DIAGNOSIS — I5033 Acute on chronic diastolic (congestive) heart failure: Secondary | ICD-10-CM | POA: Diagnosis not present

## 2021-11-15 DIAGNOSIS — I4892 Unspecified atrial flutter: Secondary | ICD-10-CM | POA: Diagnosis not present

## 2021-11-15 DIAGNOSIS — Z882 Allergy status to sulfonamides status: Secondary | ICD-10-CM

## 2021-11-15 DIAGNOSIS — I110 Hypertensive heart disease with heart failure: Secondary | ICD-10-CM | POA: Diagnosis not present

## 2021-11-15 DIAGNOSIS — Z79899 Other long term (current) drug therapy: Secondary | ICD-10-CM | POA: Diagnosis not present

## 2021-11-15 DIAGNOSIS — J90 Pleural effusion, not elsewhere classified: Secondary | ICD-10-CM | POA: Diagnosis not present

## 2021-11-15 DIAGNOSIS — I480 Paroxysmal atrial fibrillation: Secondary | ICD-10-CM | POA: Diagnosis not present

## 2021-11-15 DIAGNOSIS — I509 Heart failure, unspecified: Secondary | ICD-10-CM | POA: Diagnosis not present

## 2021-11-15 DIAGNOSIS — Z885 Allergy status to narcotic agent status: Secondary | ICD-10-CM

## 2021-11-15 DIAGNOSIS — Z880 Allergy status to penicillin: Secondary | ICD-10-CM

## 2021-11-15 DIAGNOSIS — I2721 Secondary pulmonary arterial hypertension: Secondary | ICD-10-CM | POA: Diagnosis not present

## 2021-11-15 DIAGNOSIS — J9811 Atelectasis: Secondary | ICD-10-CM | POA: Diagnosis not present

## 2021-11-15 DIAGNOSIS — M7989 Other specified soft tissue disorders: Secondary | ICD-10-CM | POA: Diagnosis present

## 2021-11-15 DIAGNOSIS — E1165 Type 2 diabetes mellitus with hyperglycemia: Secondary | ICD-10-CM | POA: Diagnosis not present

## 2021-11-15 DIAGNOSIS — K219 Gastro-esophageal reflux disease without esophagitis: Secondary | ICD-10-CM | POA: Diagnosis present

## 2021-11-15 DIAGNOSIS — E876 Hypokalemia: Secondary | ICD-10-CM | POA: Diagnosis present

## 2021-11-15 DIAGNOSIS — R0602 Shortness of breath: Secondary | ICD-10-CM | POA: Diagnosis not present

## 2021-11-15 DIAGNOSIS — Z888 Allergy status to other drugs, medicaments and biological substances status: Secondary | ICD-10-CM

## 2021-11-15 DIAGNOSIS — F419 Anxiety disorder, unspecified: Secondary | ICD-10-CM | POA: Diagnosis present

## 2021-11-15 DIAGNOSIS — R0902 Hypoxemia: Secondary | ICD-10-CM | POA: Diagnosis present

## 2021-11-15 DIAGNOSIS — Z955 Presence of coronary angioplasty implant and graft: Secondary | ICD-10-CM

## 2021-11-15 DIAGNOSIS — R7989 Other specified abnormal findings of blood chemistry: Secondary | ICD-10-CM | POA: Diagnosis not present

## 2021-11-15 DIAGNOSIS — D500 Iron deficiency anemia secondary to blood loss (chronic): Secondary | ICD-10-CM | POA: Diagnosis present

## 2021-11-15 DIAGNOSIS — D509 Iron deficiency anemia, unspecified: Secondary | ICD-10-CM | POA: Diagnosis not present

## 2021-11-15 LAB — CBC WITH DIFFERENTIAL/PLATELET
Abs Immature Granulocytes: 0.03 10*3/uL (ref 0.00–0.07)
Basophils Absolute: 0 10*3/uL (ref 0.0–0.1)
Basophils Relative: 0 %
Eosinophils Absolute: 0.1 10*3/uL (ref 0.0–0.5)
Eosinophils Relative: 1 %
HCT: 27.7 % — ABNORMAL LOW (ref 36.0–46.0)
Hemoglobin: 7.9 g/dL — ABNORMAL LOW (ref 12.0–15.0)
Immature Granulocytes: 0 %
Lymphocytes Relative: 18 %
Lymphs Abs: 1.2 10*3/uL (ref 0.7–4.0)
MCH: 19.7 pg — ABNORMAL LOW (ref 26.0–34.0)
MCHC: 28.5 g/dL — ABNORMAL LOW (ref 30.0–36.0)
MCV: 69.1 fL — ABNORMAL LOW (ref 80.0–100.0)
Monocytes Absolute: 0.6 10*3/uL (ref 0.1–1.0)
Monocytes Relative: 9 %
Neutro Abs: 4.9 10*3/uL (ref 1.7–7.7)
Neutrophils Relative %: 72 %
Platelets: 201 10*3/uL (ref 150–400)
RBC: 4.01 MIL/uL (ref 3.87–5.11)
RDW: 19 % — ABNORMAL HIGH (ref 11.5–15.5)
Smear Review: NORMAL
WBC: 6.8 10*3/uL (ref 4.0–10.5)
nRBC: 0 % (ref 0.0–0.2)

## 2021-11-15 LAB — BASIC METABOLIC PANEL
Anion gap: 10 (ref 5–15)
BUN: 21 mg/dL (ref 8–23)
CO2: 24 mmol/L (ref 22–32)
Calcium: 8.3 mg/dL — ABNORMAL LOW (ref 8.9–10.3)
Chloride: 97 mmol/L — ABNORMAL LOW (ref 98–111)
Creatinine, Ser: 1.01 mg/dL — ABNORMAL HIGH (ref 0.44–1.00)
GFR, Estimated: 56 mL/min — ABNORMAL LOW (ref >60)
Glucose, Bld: 207 mg/dL — ABNORMAL HIGH (ref 70–99)
Potassium: 3 mmol/L — ABNORMAL LOW (ref 3.5–5.1)
Sodium: 131 mmol/L — ABNORMAL LOW (ref 135–145)

## 2021-11-15 LAB — TROPONIN I (HIGH SENSITIVITY)
Troponin I (High Sensitivity): 16 ng/L (ref <18)
Troponin I (High Sensitivity): 16 ng/L (ref <18)

## 2021-11-15 LAB — BRAIN NATRIURETIC PEPTIDE: B Natriuretic Peptide: 1999.1 pg/mL — ABNORMAL HIGH (ref 0.0–100.0)

## 2021-11-15 LAB — MAGNESIUM: Magnesium: 2 mg/dL (ref 1.7–2.4)

## 2021-11-15 MED ORDER — POTASSIUM CHLORIDE 20 MEQ PO PACK
40.0000 meq | PACK | ORAL | Status: AC
  Administered 2021-11-15: 40 meq via ORAL
  Filled 2021-11-15: qty 2

## 2021-11-15 MED ORDER — FUROSEMIDE 10 MG/ML IJ SOLN
40.0000 mg | Freq: Once | INTRAMUSCULAR | Status: AC
  Administered 2021-11-15: 40 mg via INTRAVENOUS
  Filled 2021-11-15: qty 4

## 2021-11-15 MED ORDER — MAGNESIUM SULFATE 2 GM/50ML IV SOLN
2.0000 g | Freq: Once | INTRAVENOUS | Status: AC
Start: 2021-11-15 — End: 2021-11-15
  Administered 2021-11-15: 2 g via INTRAVENOUS
  Filled 2021-11-15: qty 50

## 2021-11-15 MED ORDER — DILTIAZEM HCL 25 MG/5ML IV SOLN
15.0000 mg | Freq: Once | INTRAVENOUS | Status: AC
  Administered 2021-11-15: 15 mg via INTRAVENOUS
  Filled 2021-11-15: qty 5

## 2021-11-15 NOTE — H&P (Signed)
?History and Physical  ? ? ?Patient: Emily Jones MRN: [id number] DOA: 11/15/2021 ? ?Date of Service: the patient was seen and examined on 11/16/2021 ? ?Patient coming from: Home ? ?Chief Complaint:  ?Chief Complaint  ?Patient presents with  ? Shortness of Breath  ? Leg Swelling  ? ? ?HPI:  ? ?81 year old female with past medical history of coronary artery disease (S/P STEMI 04/2020 S/P cath with DESplaced in [place], mid left circumflex and proximal left circumflex) diastolic congestive heart failure (Echo 04/2020 EF 50 to 55% with G2 DD), hyperlipidemia, hypertension, gastroesophageal reflux disease who presents to [facility] emergency department with complaints of 1 week of increasing leg swelling and dyspnea on exertion. ? ?Patient explains that approximately 1 week ago he began to experience worsening lower extremity edema.  In the days that followed, lower extremity edema continue to worsen and became associated with pillow orthopnea and dyspnea on exertion.  Dyspnea exertion was initially mild intensity but progressively became more and more severe with less and less exertion.  Patient denied any associated chest pain. ? ?Symptoms continue to progressively worsen until the patient eventually presented to [facility] emergency department for evaluation. ? ?Upon evaluation in the emergency department clinically patient was felt to be volume overloaded with significant peripheral edema and rales on examination.  BNP was found to be markedly elevated at 1999.  Chest x-ray revealed bilateral trace pleural effusions.  Patient was administered 40 mg of intravenous Lasix and shortly thereafter converted into rapid atrial flutter requiring administration of 15 mg of intravenous diltiazem.  Due to acute on chronic diastolic congestive heart failure and intermittent rapid atrial flutter the hospitalist group is now been called to assess the patient for admission to the hospital. ? ?Review of Systems: Review of Systems  ?Respiratory:   Positive for shortness of breath.   ?Cardiovascular:  Positive for orthopnea, leg swelling and PND.  ?All other systems reviewed and are negative. ? ? ?Past Medical History:  ?Diagnosis Date  ? Allergic state   ? Anxiety   ? CHF (congestive heart failure) ([place])   ? Coronary artery disease   ? Costochondritis   ? Depression   ? Diabetes mellitus without complication ([place])   ? Esophagitis   ? GERD (gastroesophageal reflux disease)   ? Headache   ? Hemorrhoids   ? Hyperlipidemia   ? Hypertension   ? Panic attacks   ? ? ?Past Surgical History:  ?Procedure Laterality Date  ? APPENDECTOMY    ? COLONOSCOPY N/A 03/29/2017  ? Procedure: COLONOSCOPY;  Surgeon: Manya Silvas, MD;  Location: [facility] ENDOSCOPY;  Service: Endoscopy;  Laterality: N/A;  ? CORONARY/GRAFT ACUTE MI REVASCULARIZATION N/A 05/20/2020  ? Procedure: Coronary/Graft Acute MI Revascularization;  Surgeon: Isaias Cowman, MD;  Location: [place] [facility] LAB;  Service: Cardiovascular;  Laterality: N/A;  ? DILATION AND CURETTAGE, DIAGNOSTIC / THERAPEUTIC    ? ESOPHAGOGASTRODUODENOSCOPY (EGD) WITH PROPOFOL N/A 03/29/2017  ? Procedure: ESOPHAGOGASTRODUODENOSCOPY (EGD) WITH PROPOFOL;  Surgeon: Manya Silvas, MD;  Location: [facility] ENDOSCOPY;  Service: Endoscopy;  Laterality: N/A;  ? KNEE SURGERY    ? LEFT HEART CATH AND CORONARY ANGIOGRAPHY N/A 05/20/2020  ? Procedure: LEFT HEART CATH AND CORONARY ANGIOGRAPHY;  Surgeon: Isaias Cowman, MD;  Location: [place] [facility] LAB;  Service: Cardiovascular;  Laterality: N/A;  ? ? ?Social History:  reports that she has never smoked. She has never used smokeless tobacco. She reports that she does not drink alcohol and does not use drugs. ? ?Allergies  ?  Allergen Reactions  ? Aspirin   ? Chlorpheniramine Maleate   ? Codeine   ? Statins Other (See Comments)  ?  Muscle Spasms  ? Sulfa Antibiotics   ? Penicillins Rash  ? ? ?Family History  ?Problem Relation Age of Onset  ? Heart attack Neg Hx   ? Heart disease Neg Hx    ? ? ?Prior to Admission medications   ?Medication Sig Start Date End Date Taking? Authorizing Provider  ?acetaminophen (TYLENOL) 325 MG tablet Take by mouth every 6 (six) hours as needed.    [provider]  ?aspirin 81 MG chewable tablet Chew 1 tablet (81 mg total) by mouth daily. 06/09/21   Alisa Graff, FNP  ?atorvastatin (LIPITOR) 80 MG tablet Take 1 tablet (80 mg total) by mouth daily. 06/09/21   Alisa Graff, FNP  ?clonazePAM (KLONOPIN) 0.5 MG tablet Take 0.25 mg by mouth daily.    [provider]  ?empagliflozin (JARDIANCE) 10 MG TABS tablet Take 1 tablet (10 mg total) by mouth daily before breakfast. ?Patient not taking: Reported on 06/09/2021 05/10/21   Alisa Graff, FNP  ?lisinopril (ZESTRIL) 2.5 MG tablet Take 1 tablet (2.5 mg total) by mouth daily. 06/09/21   Alisa Graff, FNP  ?metoprolol tartrate (LOPRESSOR) 25 MG tablet Take 1 tablet (25 mg total) by mouth 2 (two) times daily. 05/10/21   Alisa Graff, FNP  ?nitroGLYCERIN (NITROSTAT) 0.4 MG SL tablet Place 1 tablet (0.4 mg total) under the tongue every 5 (five) minutes as needed for chest pain. 05/22/20   Lorella Nimrod, MD  ?omeprazole (PRILOSEC) 20 MG capsule Take 20 mg by mouth daily.    [provider]  ?sertraline (ZOLOFT) 100 MG tablet Take 100 mg by mouth daily.    [provider]  ?ticagrelor (BRILINTA) 90 MG TABS tablet Take 1 tablet (90 mg total) by mouth 2 (two) times daily. 03/13/21   Lannie Fields, PA-C  ? ? ?Physical Exam: ? ?Vitals:  ? 11/15/21 2330 11/16/21 0000 11/16/21 0118 11/16/21 0415  ?BP: (!) 131/93 133/70 130/60 (!) 141/71  ?Pulse: (!) 55 80 86 91  ?Resp: 20 (!) '28 16 18  '$ ?Temp:  98.1 ?F (36.7 ?C) 97.9 ?F (36.6 ?C) 98.4 ?F (36.9 ?C)  ?TempSrc:  Oral    ?SpO2: 93% 98% 97% 98%  ?Weight:      ?Height:      ? ? ?Constitutional: Awake alert and oriented x3, no associated distress.   ?Skin: Increased skin pallor noted.  Poor skin turgor noted.  Scattered areas of minor skin breakdown  with weeping of the anterior surfaces of the bilateral lower extremities. ?Eyes: Pupils are equally reactive to light.  Increased conjunctival pallor without scleral icterus.   ?ENMT: Moist mucous membranes noted.  Posterior pharynx clear of any exudate or lesions.   ?Neck: normal, supple, no masses, no thyromegaly.  Significant jugular venous distention at 45 degrees. ?Respiratory: Mild bibasilar rales noted without wheezing.  ? Normal respiratory effort. No accessory muscle use.  ?Cardiovascular: Regular rate and rhythm, no murmurs / rubs / gallops.  Notable significant distal bilateral lower extremity pitting edema that tracks to just above the knees bilaterally.  2+ pedal pulses. No carotid bruits.  ?Chest:   Nontender without crepitus or deformity.   ?Back:   Nontender without crepitus or deformity. ?Abdomen: Abdomen is somewhat protuberant but soft and nontender.  No evidence of intra-abdominal masses.  Positive bowel sounds noted in all quadrants.   ?Musculoskeletal: Some  notable tenderness to palpation of the distal bilateral lower extremities.  No joint deformity upper and lower extremities. Good ROM, no contractures. Normal muscle tone.  ?Neurologic: CN 2-12 grossly intact. Sensation intact.  Patient moving all 4 extremities spontaneously.  Patient is following all commands.  Patient is responsive to verbal stimuli.   ?Psychiatric: Patient exhibits normal mood with appropriate affect.  Patient seems to possess insight as to their current situation.   ? ?Data Reviewed: ? ?I have personally reviewed and interpreted labs, imaging. ? ?Significant findings are Hemoglobin 7.9, down from 9.38 months ago.  Sodium 131, potassium 3.0, BNP 1999 ? ?Chest x-ray personally reviewed revealing trace bilateral pleural effusions. ? ?EKG: Personally reviewed.  Rhythm is atrial flutter with heart rate of 124 bpm.  No dynamic ST segment changes appreciated. ? ? ?Assessment and Plan: ?* Acute on chronic diastolic (congestive)  heart failure (HCC) ?Patient presenting with pillow orthopnea dyspnea on exertion and peripheral edema with markedly elevated BNP all concerning for acute on chronic diastolic congestive heart failure. ?Patient ha

## 2021-11-15 NOTE — ED Provider Notes (Signed)
? ?[facility] ?Provider Note ? ? ? Event Date/Time  ? First MD Initiated Contact with Patient 11/15/21 2054   ?  (approximate) ? ? ?History  ? ?Shortness of Breath and Leg Swelling ? ? ?HPI ? ?Emily Jones is a 81 y.o. female with a past history of diabetes hypertension CAD who comes ED complaining of gradually worsening shortness of breath which also gets worse with walking or laying down.  Associated with worsening swelling of both legs with some serous oozing.  Denies any significant chest pain except for a brief episode of pain between her shoulder blades yesterday which resolved on its own after a few minutes.  No recent medication changes or viral illness. ?  ? ? ?Physical Exam  ? ?Triage Vital Signs: ?ED Triage Vitals  ?Enc Vitals Group  ?   BP 11/15/21 1855 (!) 157/73  ?   Pulse Rate 11/15/21 1847 87  ?   Resp 11/15/21 1847 16  ?   Temp 11/15/21 1855 98.2 ?F (36.8 ?C)  ?   Temp Source 11/15/21 1855 Oral  ?   SpO2 11/15/21 1847 100 %  ?   Weight 11/15/21 1847 135 lb (61.2 kg)  ?   Height 11/15/21 1847 '5\' 5"'$  (1.651 m)  ?   Head Circumference --   ?   Peak Flow --   ?   Pain Score 11/15/21 1847 0  ?   Pain Loc --   ?   Pain Edu? --   ?   Excl. in [place]? --   ? ? ?Most recent vital signs: ?Vitals:  ? 11/15/21 2230 11/15/21 2300  ?BP: 138/78 136/77  ?Pulse: 77 81  ?Resp: 18 20  ?Temp:    ?SpO2: 97% 99%  ? ? ? ?General: Awake, no distress.  ?CV:  Good peripheral perfusion.  Regular rate and rhythm. ?Resp:  Normal effort.  Bilateral basilar crackles.  Symmetric air movement. ?Abd:  No distention.  Soft nontender ?Other:  2+ pitting edema bilateral lower extremities, symmetric.  There is no calf tenderness, negative Homans' sign.  There is 1 small superficial skin break on each lower leg with some serous drainage.  No significant erythema or other inflammatory changes. ? ? ?ED Results / Procedures / Treatments  ? ?Labs ?(all labs ordered are listed, but only abnormal results are  displayed) ?Labs Reviewed  ?CBC WITH DIFFERENTIAL/PLATELET - Abnormal; Notable for the following components:  ?    Result Value  ? Hemoglobin 7.9 (*)   ? HCT 27.7 (*)   ? MCV 69.1 (*)   ? MCH 19.7 (*)   ? MCHC 28.5 (*)   ? RDW 19.0 (*)   ? All other components within normal limits  ?BASIC METABOLIC PANEL - Abnormal; Notable for the following components:  ? Sodium 131 (*)   ? Potassium 3.0 (*)   ? Chloride 97 (*)   ? Glucose, Bld 207 (*)   ? Creatinine, Ser 1.01 (*)   ? Calcium 8.3 (*)   ? GFR, Estimated 56 (*)   ? All other components within normal limits  ?BRAIN NATRIURETIC PEPTIDE - Abnormal; Notable for the following components:  ? B Natriuretic Peptide 1,999.1 (*)   ? All other components within normal limits  ?MAGNESIUM  ?TROPONIN I (HIGH SENSITIVITY)  ?TROPONIN I (HIGH SENSITIVITY)  ? ? ? ?EKG ? ?Interpreted by me ?Normal sinus rhythm rate of 87.  Normal axis and intervals.  Poor R wave progression.  Normal  ST segments and T waves. ? ?Repeat EKG at 2157 shows patient is now in atrial flutter, rate of 124.  Normal axis, slightly prolonged QTc.  Poor R wave progression.  No ischemic changes. ? ? ?RADIOLOGY ?Chest x-ray viewed and interpreted by me, shows small bilateral pleural effusions, no large pulmonary edema or pneumothorax. ? ? ? ?PROCEDURES: ? ?Critical Care performed: Yes, see critical care procedure note(s) ? ?.Critical Care ?Performed by: Carrie Mew, MD ?Authorized by: Carrie Mew, MD  ? ?Critical care provider statement:  ?  Critical care time (minutes):  35 ?  Critical care time was exclusive of:  Separately billable procedures and treating other patients ?  Critical care was necessary to treat or prevent imminent or life-threatening deterioration of the following conditions:  Cardiac failure ?  Critical care was time spent personally by me on the following activities:  Development of treatment plan with patient or surrogate, discussions with consultants, evaluation of patient's response  to treatment, examination of patient, obtaining history from patient or surrogate, ordering and performing treatments and interventions, ordering and review of laboratory studies, ordering and review of radiographic studies, pulse oximetry, re-evaluation of patient's condition and review of old charts ? ? ?MEDICATIONS ORDERED IN ED: ?Medications  ?furosemide (LASIX) injection 40 mg (40 mg Intravenous Given 11/15/21 2127)  ?potassium chloride (KLOR-CON) packet 40 mEq (40 mEq Oral Given 11/15/21 2122)  ?magnesium sulfate IVPB 2 g 50 mL (0 g Intravenous Stopped 11/15/21 2322)  ?diltiazem (CARDIZEM) injection 15 mg (15 mg Intravenous Given 11/15/21 2217)  ? ? ? ?IMPRESSION / MDM / ASSESSMENT AND PLAN / ED COURSE  ?I reviewed the triage vital signs and the nursing notes. ?             ?               ? ?Differential diagnosis includes, but is not limited to, electrolyte abnormality, non-STEMI, idiopathic cardiomyopathy ? ?**The patient is on the cardiac monitor to evaluate for evidence of arrhythmia and/or significant heart rate changes.**} ? ?Patient presents with symptoms of CHF without a past history.  Oxygen saturations okay, no respiratory compromise, but significantly symptomatic.  Will give IV Lasix in the ED.  If she is able to diurese and symptoms improve, she may go to follow-up with heart failure clinic. ? ?Clinical Course as of 11/15/21 2332  ?Wed Nov 15, 2021  ?2202 Initial EKG unremarkable, chest x-ray shows small pleural effusions.  Exam and labs consistent with symptomatic CHF.  With her age and significant functional decline, I think she would benefit from hospitalization.  Gave her dose of IV Lasix to start diuresis in the ED, and later it was noted on telemetry that heart rate had increased to 124.  Repeat EKG shows atrial flutter with variable block.  We will give a dose of IV diltiazem and IV magnesium. [PS]  ?  ?Clinical Course User Index ?[PS] Carrie Mew, MD   ? ? ?----------------------------------------- ?11:45 PM on 11/15/2021 ?----------------------------------------- ?Patient converted right back to sinus rhythm after IV diltiazem.  She did have a brief episode of chest tightness in the setting of atrial flutter with a heart rate of 125.  Recommended hospitalization at this point to which patient agrees.  Case discussed with hospitalist. ? ? ?FINAL CLINICAL IMPRESSION(S) / ED DIAGNOSES  ? ?Final diagnoses:  ?New onset of congestive heart failure ([place])  ?Atrial flutter, unspecified type ([place])  ? ? ? ?Rx / DC Orders  ? ?ED Discharge Orders   ? ?  None  ? ?  ? ? ? ?Note:  This document was prepared using Dragon voice recognition software and may include unintentional dictation errors. ?  ?Carrie Mew, MD ?11/15/21 2349 ? ?

## 2021-11-15 NOTE — ED Triage Notes (Signed)
Pt to ED via POV for shortness of breath and bilateral leg swelling. Pt states that she has noticed increased swelling and feeling more short of breath over the past week. Pt daughter said that she started noticing the swelling earlier this week when pt was not able to get up off the toilet by herself. Pt also noticed that she has small sores coming up on her lower legs and feet that are weeping. Pt is able to speak in complete sentences at this time. Pt is in NAD.  ?

## 2021-11-16 ENCOUNTER — Other Ambulatory Visit: Payer: Self-pay

## 2021-11-16 ENCOUNTER — Observation Stay: Payer: PPO

## 2021-11-16 ENCOUNTER — Observation Stay
Admit: 2021-11-16 | Discharge: 2021-11-16 | Disposition: A | Discharge status: Home / Self Care | Payer: PPO | Attending: Internal Medicine | Admitting: Internal Medicine

## 2021-11-16 DIAGNOSIS — Z888 Allergy status to other drugs, medicaments and biological substances status: Secondary | ICD-10-CM | POA: Diagnosis not present

## 2021-11-16 DIAGNOSIS — Z955 Presence of coronary angioplasty implant and graft: Secondary | ICD-10-CM | POA: Diagnosis not present

## 2021-11-16 DIAGNOSIS — E1165 Type 2 diabetes mellitus with hyperglycemia: Secondary | ICD-10-CM | POA: Diagnosis present

## 2021-11-16 DIAGNOSIS — I110 Hypertensive heart disease with heart failure: Secondary | ICD-10-CM | POA: Diagnosis present

## 2021-11-16 DIAGNOSIS — F419 Anxiety disorder, unspecified: Secondary | ICD-10-CM | POA: Diagnosis present

## 2021-11-16 DIAGNOSIS — I5033 Acute on chronic diastolic (congestive) heart failure: Secondary | ICD-10-CM | POA: Diagnosis present

## 2021-11-16 DIAGNOSIS — Z79899 Other long term (current) drug therapy: Secondary | ICD-10-CM | POA: Diagnosis not present

## 2021-11-16 DIAGNOSIS — F410 Panic disorder [episodic paroxysmal anxiety] without agoraphobia: Secondary | ICD-10-CM | POA: Diagnosis present

## 2021-11-16 DIAGNOSIS — I4892 Unspecified atrial flutter: Secondary | ICD-10-CM | POA: Diagnosis present

## 2021-11-16 DIAGNOSIS — I2721 Secondary pulmonary arterial hypertension: Secondary | ICD-10-CM | POA: Diagnosis present

## 2021-11-16 DIAGNOSIS — R7989 Other specified abnormal findings of blood chemistry: Secondary | ICD-10-CM | POA: Diagnosis not present

## 2021-11-16 DIAGNOSIS — K219 Gastro-esophageal reflux disease without esophagitis: Secondary | ICD-10-CM | POA: Diagnosis present

## 2021-11-16 DIAGNOSIS — E876 Hypokalemia: Secondary | ICD-10-CM | POA: Diagnosis present

## 2021-11-16 DIAGNOSIS — Z7982 Long term (current) use of aspirin: Secondary | ICD-10-CM | POA: Diagnosis not present

## 2021-11-16 DIAGNOSIS — Z882 Allergy status to sulfonamides status: Secondary | ICD-10-CM | POA: Diagnosis not present

## 2021-11-16 DIAGNOSIS — D500 Iron deficiency anemia secondary to blood loss (chronic): Secondary | ICD-10-CM | POA: Diagnosis present

## 2021-11-16 DIAGNOSIS — J9811 Atelectasis: Secondary | ICD-10-CM | POA: Diagnosis not present

## 2021-11-16 DIAGNOSIS — I252 Old myocardial infarction: Secondary | ICD-10-CM | POA: Diagnosis not present

## 2021-11-16 DIAGNOSIS — E1169 Type 2 diabetes mellitus with other specified complication: Secondary | ICD-10-CM | POA: Diagnosis present

## 2021-11-16 DIAGNOSIS — I480 Paroxysmal atrial fibrillation: Secondary | ICD-10-CM | POA: Diagnosis present

## 2021-11-16 DIAGNOSIS — I509 Heart failure, unspecified: Secondary | ICD-10-CM

## 2021-11-16 DIAGNOSIS — I2510 Atherosclerotic heart disease of native coronary artery without angina pectoris: Secondary | ICD-10-CM | POA: Diagnosis present

## 2021-11-16 DIAGNOSIS — Z880 Allergy status to penicillin: Secondary | ICD-10-CM | POA: Diagnosis not present

## 2021-11-16 DIAGNOSIS — F32A Depression, unspecified: Secondary | ICD-10-CM | POA: Diagnosis present

## 2021-11-16 DIAGNOSIS — E782 Mixed hyperlipidemia: Secondary | ICD-10-CM | POA: Diagnosis present

## 2021-11-16 DIAGNOSIS — Z885 Allergy status to narcotic agent status: Secondary | ICD-10-CM | POA: Diagnosis not present

## 2021-11-16 LAB — IRON AND TIBC
Iron: 17 ug/dL — ABNORMAL LOW (ref 28–170)
Saturation Ratios: 4 % — ABNORMAL LOW (ref 10.4–31.8)
TIBC: 472 ug/dL — ABNORMAL HIGH (ref 250–450)
UIBC: 455 ug/dL

## 2021-11-16 LAB — GLUCOSE, CAPILLARY
Glucose-Capillary: 209 mg/dL — ABNORMAL HIGH (ref 70–99)
Glucose-Capillary: 202 mg/dL — ABNORMAL HIGH (ref 70–99)
Glucose-Capillary: 156 mg/dL — ABNORMAL HIGH (ref 70–99)
Glucose-Capillary: 214 mg/dL — ABNORMAL HIGH (ref 70–99)

## 2021-11-16 LAB — TYPE AND SCREEN
ABO/RH(D): O NEG
Antibody Screen: NEGATIVE

## 2021-11-16 LAB — CBC WITH DIFFERENTIAL/PLATELET
Abs Immature Granulocytes: 0.02 10*3/uL (ref 0.00–0.07)
Basophils Absolute: 0 10*3/uL (ref 0.0–0.1)
Basophils Relative: 0 %
Eosinophils Absolute: 0.1 10*3/uL (ref 0.0–0.5)
Eosinophils Relative: 1 %
HCT: 25.9 % — ABNORMAL LOW (ref 36.0–46.0)
Hemoglobin: 7.4 g/dL — ABNORMAL LOW (ref 12.0–15.0)
Immature Granulocytes: 0 %
Lymphocytes Relative: 16 %
Lymphs Abs: 0.9 10*3/uL (ref 0.7–4.0)
MCH: 19.6 pg — ABNORMAL LOW (ref 26.0–34.0)
MCHC: 28.6 g/dL — ABNORMAL LOW (ref 30.0–36.0)
MCV: 68.5 fL — ABNORMAL LOW (ref 80.0–100.0)
Monocytes Absolute: 0.4 10*3/uL (ref 0.1–1.0)
Monocytes Relative: 8 %
Neutro Abs: 4.1 10*3/uL (ref 1.7–7.7)
Neutrophils Relative %: 75 %
Platelets: 182 10*3/uL (ref 150–400)
RBC: 3.78 MIL/uL — ABNORMAL LOW (ref 3.87–5.11)
RDW: 18.9 % — ABNORMAL HIGH (ref 11.5–15.5)
WBC: 5.5 10*3/uL (ref 4.0–10.5)
nRBC: 0 % (ref 0.0–0.2)

## 2021-11-16 LAB — COMPREHENSIVE METABOLIC PANEL
ALT: 29 U/L (ref 0–44)
AST: 18 U/L (ref 15–41)
Albumin: 3.4 g/dL — ABNORMAL LOW (ref 3.5–5.0)
Alkaline Phosphatase: 69 U/L (ref 38–126)
Anion gap: 10 (ref 5–15)
BUN: 20 mg/dL (ref 8–23)
CO2: 25 mmol/L (ref 22–32)
Calcium: 8.4 mg/dL — ABNORMAL LOW (ref 8.9–10.3)
Chloride: 97 mmol/L — ABNORMAL LOW (ref 98–111)
Creatinine, Ser: 1.03 mg/dL — ABNORMAL HIGH (ref 0.44–1.00)
GFR, Estimated: 55 mL/min — ABNORMAL LOW (ref >60)
Glucose, Bld: 190 mg/dL — ABNORMAL HIGH (ref 70–99)
Potassium: 3.3 mmol/L — ABNORMAL LOW (ref 3.5–5.1)
Sodium: 132 mmol/L — ABNORMAL LOW (ref 135–145)
Total Bilirubin: 1.3 mg/dL — ABNORMAL HIGH (ref 0.3–1.2)
Total Protein: 6.5 g/dL (ref 6.5–8.1)

## 2021-11-16 LAB — VITAMIN B12: Vitamin B-12: 604 pg/mL (ref 180–914)

## 2021-11-16 LAB — TSH: TSH: 2.747 u[IU]/mL (ref 0.350–4.500)

## 2021-11-16 LAB — MAGNESIUM: Magnesium: 2.4 mg/dL (ref 1.7–2.4)

## 2021-11-16 LAB — D-DIMER, QUANTITATIVE: D-Dimer, Quant: 2.03 ug/mL-FEU — ABNORMAL HIGH (ref 0.00–0.50)

## 2021-11-16 LAB — FOLATE: Folate: 8.1 ng/mL (ref >5.9)

## 2021-11-16 LAB — APTT: aPTT: 41 seconds — ABNORMAL HIGH (ref 24–36)

## 2021-11-16 LAB — PROTIME-INR
INR: 1.3 — ABNORMAL HIGH (ref 0.8–1.2)
Prothrombin Time: 16.5 seconds — ABNORMAL HIGH (ref 11.4–15.2)

## 2021-11-16 MED ORDER — SODIUM CHLORIDE 0.9 % IV SOLN
510.0000 mg | Freq: Once | INTRAVENOUS | Status: AC
  Administered 2021-11-16: 510 mg via INTRAVENOUS
  Filled 2021-11-16: qty 17

## 2021-11-16 MED ORDER — ONDANSETRON HCL 4 MG/2ML IJ SOLN: 4.0000 mg | Freq: Four times a day (QID) | INTRAMUSCULAR | Status: DC | PRN

## 2021-11-16 MED ORDER — PANTOPRAZOLE SODIUM 40 MG IV SOLR
40.0000 mg | Freq: Two times a day (BID) | INTRAVENOUS | Status: DC
  Administered 2021-11-16 – 2021-11-17 (×4): 40 mg via INTRAVENOUS
  Filled 2021-11-16 (×4): qty 10

## 2021-11-16 MED ORDER — HYDRALAZINE HCL 20 MG/ML IJ SOLN
10.0000 mg | Freq: Four times a day (QID) | INTRAMUSCULAR | Status: DC | PRN
Start: 2021-11-16 — End: 2021-11-17

## 2021-11-16 MED ORDER — FUROSEMIDE 10 MG/ML IJ SOLN
40.0000 mg | Freq: Two times a day (BID) | INTRAMUSCULAR | Status: DC
  Administered 2021-11-16 (×2): 40 mg via INTRAVENOUS
  Filled 2021-11-16 (×2): qty 4

## 2021-11-16 MED ORDER — LISINOPRIL 5 MG PO TABS
2.5000 mg | ORAL_TABLET | Freq: Every day | ORAL | Status: DC
Start: 2021-11-16 — End: 2021-11-17
  Administered 2021-11-16: 2.5 mg via ORAL
  Filled 2021-11-16: qty 1

## 2021-11-16 MED ORDER — ASPIRIN 81 MG PO CHEW
81.0000 mg | CHEWABLE_TABLET | Freq: Every day | ORAL | Status: DC
  Administered 2021-11-16 – 2021-11-17 (×2): 81 mg via ORAL
  Filled 2021-11-16 (×2): qty 1

## 2021-11-16 MED ORDER — ACETAMINOPHEN 650 MG RE SUPP: 650.0000 mg | Freq: Four times a day (QID) | RECTAL | Status: DC | PRN

## 2021-11-16 MED ORDER — ACETAMINOPHEN 325 MG PO TABS: 650.0000 mg | ORAL_TABLET | Freq: Four times a day (QID) | ORAL | Status: DC | PRN

## 2021-11-16 MED ORDER — CLONAZEPAM 0.25 MG PO TBDP
0.2500 mg | ORAL_TABLET | Freq: Every day | ORAL | Status: DC
  Administered 2021-11-16 – 2021-11-17 (×2): 0.25 mg via ORAL
  Filled 2021-11-16 (×2): qty 1

## 2021-11-16 MED ORDER — METOPROLOL TARTRATE 25 MG PO TABS
25.0000 mg | ORAL_TABLET | Freq: Two times a day (BID) | ORAL | Status: DC
  Administered 2021-11-16 – 2021-11-17 (×3): 25 mg via ORAL
  Filled 2021-11-16 (×3): qty 1

## 2021-11-16 MED ORDER — TICAGRELOR 90 MG PO TABS
90.0000 mg | ORAL_TABLET | Freq: Two times a day (BID) | ORAL | Status: DC
  Administered 2021-11-16: 90 mg via ORAL
  Filled 2021-11-16: qty 1

## 2021-11-16 MED ORDER — POTASSIUM CHLORIDE CRYS ER 20 MEQ PO TBCR
40.0000 meq | EXTENDED_RELEASE_TABLET | Freq: Once | ORAL | Status: AC
  Administered 2021-11-16: 40 meq via ORAL
  Filled 2021-11-16: qty 2

## 2021-11-16 MED ORDER — PANTOPRAZOLE SODIUM 40 MG PO TBEC: 40.0000 mg | DELAYED_RELEASE_TABLET | Freq: Every day | ORAL | Status: DC

## 2021-11-16 MED ORDER — POLYETHYLENE GLYCOL 3350 17 G PO PACK: 17.0000 g | PACK | Freq: Every day | ORAL | Status: DC | PRN

## 2021-11-16 MED ORDER — ATORVASTATIN CALCIUM 20 MG PO TABS
80.0000 mg | ORAL_TABLET | Freq: Every day | ORAL | Status: DC
  Administered 2021-11-16 – 2021-11-17 (×2): 80 mg via ORAL
  Filled 2021-11-16 (×2): qty 4

## 2021-11-16 MED ORDER — SERTRALINE HCL 50 MG PO TABS
100.0000 mg | ORAL_TABLET | Freq: Every day | ORAL | Status: DC
  Administered 2021-11-16 – 2021-11-17 (×2): 100 mg via ORAL
  Filled 2021-11-16 (×2): qty 2

## 2021-11-16 MED ORDER — ONDANSETRON HCL 4 MG PO TABS: 4.0000 mg | ORAL_TABLET | Freq: Four times a day (QID) | ORAL | Status: DC | PRN

## 2021-11-16 MED ORDER — NITROGLYCERIN 0.4 MG SL SUBL: 0.4000 mg | SUBLINGUAL_TABLET | SUBLINGUAL | Status: DC | PRN

## 2021-11-16 MED ORDER — INSULIN ASPART 100 UNIT/ML IJ SOLN
0.0000 [IU] | Freq: Three times a day (TID) | INTRAMUSCULAR | Status: DC
  Administered 2021-11-16 (×2): 5 [IU] via SUBCUTANEOUS
  Administered 2021-11-16: 3 [IU] via SUBCUTANEOUS
  Administered 2021-11-16: 5 [IU] via SUBCUTANEOUS
  Administered 2021-11-17: 3 [IU] via SUBCUTANEOUS
  Filled 2021-11-16 (×5): qty 1

## 2021-11-16 MED ORDER — IOHEXOL 350 MG/ML SOLN
75.0000 mL | Freq: Once | INTRAVENOUS | Status: AC | PRN
  Administered 2021-11-16: 75 mL via INTRAVENOUS

## 2021-11-16 NOTE — Assessment & Plan Note (Addendum)
?   Patient is currently chest pain free ?? Monitoring patient on telemetry ?? Continue home regimen of antiplatelet therapy for now although this may need to be adjusted if hemoglobin is downtrending,s he might not need DAPT now as her PCI is more than a year out, message sent to her cardiologist ?      -Continuing lipid lowering therapy and metoprolol. ?

## 2021-11-16 NOTE — Assessment & Plan Note (Signed)
.   Continuing home regimen of lipid lowering therapy.  

## 2021-11-16 NOTE — Assessment & Plan Note (Addendum)
?   Blood pressure currently within goal. ?? Continue home dose of metoprolol and low-dose lisinopril.  She is also being diuresed with IV Lasix currently. ?? Monitor blood pressure ? ? ?

## 2021-11-16 NOTE — Assessment & Plan Note (Addendum)
?   Review of patient's hemoglobin shows a substantial downtrend since approximately 1 year ago ?? Possibly secondary to chronic iron deficiency anemia from chronic gastrointestinal bleeding however patient admits to me that she has not been consistently taking her aspirin and has run out of Brilinta in the recent past. ?? Hemoglobin continue to trend downward, 7.4 this morning.  Anemia panel with quite significant iron deficiency.  No obvious bleeding.  ?? Prior EGD and colonoscopy done in 2018 was without any significant abnormality, some internal hemorrhoids noted. ?? Give her a dose of Feraheme ?? Start her on iron supplement ?? Check FOBT ?? Patient will need outpatient GI evaluation if having positive FOBT ?? Message sent to her cardiologist to discuss the need of DAPT as she is more than 1 year out of her PCI ?

## 2021-11-16 NOTE — Assessment & Plan Note (Addendum)
?   Patient been placed on Accu-Cheks before every meal and nightly with sliding scale insulin ?? Holding home regimen of hypoglycemics ?? Hemoglobin A1C ordered-pending ?? Diabetic Diet  ? ?

## 2021-11-16 NOTE — Assessment & Plan Note (Addendum)
?   Magnesium within normal limit. ?? Replacing with oral potassium chloride ?? Monitoring potassium levels with serial chemistries ? ?

## 2021-11-16 NOTE — Consult Note (Addendum)
? ?  Heart Failure Nurse Navigator Note ? ?HFpEF 50 to 55%.  Grade 2 diastolic dysfunction.  Mild biatrial enlargement.  Echocardiogram results pending on this admission. ? ? ?She presented to the emergency room with 1 week history of worsening lower extremity edema, orthopnea and dyspnea on exertion. ? ? ?Comorbidities: ? ?Coronary artery disease with stenting September 2021 ?Hyperlipidemia ?Hypertension ?GERD ?Atrial flutter ? ?Medications: ? ?Aspirin 81 mg daily ?Atorvastatin 80 mg daily ?Furosemide 40 mg IV 2 times daily ?Lisinopril 2.5 mg daily ?Metoprolol tartrate 25 mg 2 times a day ? ?Labs: ? ?Sodium 132, potassium 3.3, chloride 97, CO2 25, BUN 20, creatinine 1.03, magnesium 2.4, BNP 1999, hemoglobin 7.4 and hematocrit 25.9 ?Weight is 65.5 kg ?Blood pressure 113/71 ?Intake 50 mL ?Output 1500 mL ? ?Initial meeting with patient, she was lying in bed in no acute distress. ? ?She states she lives with roommate.  She does not have a battery for her scale so she has not been weighing herself. ? ?Had noted the lower extremity edema and weeping for about a week. ? ?She is non compliant with her diet. Eating soups and sandwiches, long with getting take out from restaurants. She states she drinks a lot of water during the day.  Discussed the  importance of limiting sodium intake to 2000 mg daily along with fluid restriction of 64 ounces. She states she does not add salt at the table. ? ?Talked about follow up in the heart failure clinic, she states that she had been released from there.  Advised that she could have called with questions or concerns.  She has follow up on March 30 at 4 PM. ? ?She is given the living with heart failure teaching booklet, zone magnet and info on low sodium. ? ?She is a [facility] patient and would qualify for the [place] program but does not have the needed technology. ? ?She had not further questions. ? ?Pricilla Riffle RN CHFN ? ? ? ?

## 2021-11-16 NOTE — Assessment & Plan Note (Addendum)
?   Patient exhibited a short run of atrial flutter in the emergency department as evidenced by twelve-lead EKG ?? Patient was given a one-time dose of intravenous diltiazem causing almost immediate resolution of the atrial flutter ?? Patient does not have a history of atrial flutter ?? TSH and magnesium within normal limit, mild hypokalemia which is being repleted. ?? Resume home regimen of metoprolol ? ?

## 2021-11-16 NOTE — Progress Notes (Signed)
Mobility Specialist - Progress Note ? ? 11/16/21 1700  ?Mobility  ?Activity Dangled on edge of bed  ?Level of Assistance Standby assist, set-up cues, supervision of patient - no hands on  ?Assistive Device None  ?Distance Ambulated (ft) 0 ft  ?Activity Response Tolerated well  ?$Mobility charge 1 Mobility  ? ? ? ?Pt dangled EOB with supervision. Good sitting balance. Does voice feeling winded when lying supine---elevated HOB for better airway and educated pt on PLB. Pt left EOB per request d/t supper arrival, alarm set, needs in reach.  ? ? ?Kathee Delton ?Mobility Specialist ?11/16/21, 5:04 PM ? ? ? ? ?

## 2021-11-16 NOTE — Consult Note (Signed)
?[place] [facility]ARDIOLOG[facility] CONSULT NOTE  ? ?    ?Patient ID: ?Emily Jones ?MRN: [id number] ?DOB/AGE: August 04, 1941 81 y.o. ? ?Admit date: 11/15/2021 ?Referring Physician Dr. Lorella Nimrod ?Primary Physician Dr. Maryland Pink ?Primary Cardiologist Dr. Saralyn Pilar ?Reason for Consultation acute on chronic HFpEF ? ?HPI: The patient is an 81 year old female with a past medical history of CAD with inferior STEMI 05/20/2020 s/p PCI with DES x3 to OM1, mid LCx, proximal LCx, HFpEF LVEF 50-55%, G2 DD with biatrial mild dilation, moderate MR 04/2020) hypertension, hyperlipidemia, type 2 diabetes, GERD, depression who presented to [facility] ED 11/15/2021 with worsening shortness of breath, exertional dyspnea, and orthopnea.  She has reportedly been taking aspirin and Brilinta since her STEMI in 2021 but has not been seen by outpatient cardiology since her stent placements. Cardiology is consulted for further assistance with diuresis and new onset atrial fibrillation in the ED.  ? ?The patient states for the past week and a half she was having progressively worsening lower extremity swelling up to her thighs with weeping clear fluid from both of her lower legs.  Also admits to a slight dry cough and exertional dyspnea.  She thinks she had some transient chest discomfort/upper back discomfort 3 days ago that was short-lived and not similar in character to her STEMI in 2021 (had significant chest and arm pain that did not resolve until stent was placed).  Admits to significant lower leg weakness prior to admission and orthopnea.  She has been eating lots of canned foods, Ritz crackers, and craving and eating lots of ice lately.  Denies palpitations, syncope hematochezia, melena.  She feels much better since receiving IV Lasix and her leg swelling has much improved. ? ?On admission her blood pressure was 163/85, improved today to 113/74.  Her EKG initially showed sinus rhythm with rate 87 and a nonspecific T wave abnormality, repeat 2 hours  after admission showed atrial flutter versus fibrillation with rate of 124 and was given 1 dose of IV diltiazem and IV mag with conversion to normal sinus rhythm. ? ?Labs on admission are notable for a sodium of 132, potassium 3.3, BUN 28 creatinine 1.03, EGFR 55 BNP elevated to 1999, high-sensitivity troponin flat 16-16.  Iron panel with iron level 17, TIBC 472 iron ratios decreased.  H&H shows a downtrending H&H from 7.9-7.4/27.7-27.9.  Platelets 182.  D-dimer is elevated to 2.03.  Chest x-ray with bilateral small pleural effusions without pneumothorax or pulmonary edema.  CTA chest negative for pulmonary embolism, but showed moderate coronary artery calcifications and morphologic changes in keeping with pulmonary arterial hypertension with large bilateral pleural effusions and associated compressive atelectasis of dependent lungs. ? ?Review of systems complete and found to be negative unless listed above  ? ? ? ?Past Medical History:  ?Diagnosis Date  ? Allergic state   ? Anxiety   ? CHF (congestive heart failure) ([place])   ? Coronary artery disease   ? Costochondritis   ? Depression   ? Diabetes mellitus without complication ([place])   ? Esophagitis   ? GERD (gastroesophageal reflux disease)   ? Headache   ? Hemorrhoids   ? Hyperlipidemia   ? Hypertension   ? Panic attacks   ?  ?Past Surgical History:  ?Procedure Laterality Date  ? APPENDECTOMY    ? COLONOSCOPY N/A 03/29/2017  ? Procedure: COLONOSCOPY;  Surgeon: Manya Silvas, MD;  Location: [facility] ENDOSCOPY;  Service: Endoscopy;  Laterality: N/A;  ? CORONARY/GRAFT ACUTE MI REVASCULARIZATION N/A 05/20/2020  ? Procedure:  Coronary/Graft Acute MI Revascularization;  Surgeon: Isaias Cowman, MD;  Location: [place] CV LAB;  Service: Cardiovascular;  Laterality: N/A;  ? DILATION AND CURETTAGE, DIAGNOSTIC / THERAPEUTIC    ? ESOPHAGOGASTRODUODENOSCOPY (EGD) WITH PROPOFOL N/A 03/29/2017  ? Procedure: ESOPHAGOGASTRODUODENOSCOPY (EGD) WITH PROPOFOL;  Surgeon: Manya Silvas, MD;  Location: [facility] ENDOSCOPY;  Service: Endoscopy;  Laterality: N/A;  ? KNEE SURGERY    ? LEFT HEART CATH AND CORONARY ANGIOGRAPHY N/A 05/20/2020  ? Procedure: LEFT HEART CATH AND CORONARY ANGIOGRAPHY;  Surgeon: Isaias Cowman, MD;  Location: [place] CV LAB;  Service: Cardiovascular;  Laterality: N/A;  ?  ?Medications Prior to Admission  ?Medication Sig Dispense Refill Last Dose  ? clonazePAM (KLONOPIN) 0.5 MG tablet Take 0.25 mg by mouth daily.   11/15/2021  ? lisinopril (ZESTRIL) 2.5 MG tablet Take 1 tablet (2.5 mg total) by mouth daily. 90 tablet 3 11/15/2021  ? metoprolol tartrate (LOPRESSOR) 25 MG tablet Take 1 tablet (25 mg total) by mouth 2 (two) times daily. 60 tablet 5 11/15/2021  ? omeprazole (PRILOSEC) 20 MG capsule Take 20 mg by mouth daily.   11/15/2021  ? sertraline (ZOLOFT) 100 MG tablet Take 100 mg by mouth daily.   11/15/2021  ? acetaminophen (TYLENOL) 325 MG tablet Take 650 mg by mouth every 6 (six) hours as needed for mild pain.   prn at unknown  ? aspirin 81 MG chewable tablet Chew 1 tablet (81 mg total) by mouth daily. (Patient not taking: Reported on 11/16/2021) 90 tablet 3 Not Taking  ? atorvastatin (LIPITOR) 80 MG tablet Take 1 tablet (80 mg total) by mouth daily. 90 tablet 3 11/14/2021  ? empagliflozin (JARDIANCE) 10 MG TABS tablet Take 1 tablet (10 mg total) by mouth daily before breakfast. (Patient not taking: Reported on 06/09/2021) 14 tablet 0   ? nitroGLYCERIN (NITROSTAT) 0.4 MG SL tablet Place 1 tablet (0.4 mg total) under the tongue every 5 (five) minutes as needed for chest pain. 20 tablet 12 prn at unknown  ? ticagrelor (BRILINTA) 90 MG TABS tablet Take 1 tablet (90 mg total) by mouth 2 (two) times daily. (Patient not taking: Reported on 11/16/2021) 60 tablet 1 Not Taking  ? ?Social History  ? ?Socioeconomic History  ? Marital status: Married  ?  Spouse name: Not on file  ? Number of children: Not on file  ? Years of education: Not on file  ? Highest education level:  Not on file  ?Occupational History  ? Not on file  ?Tobacco Use  ? Smoking status: Never  ? Smokeless tobacco: Never  ?Substance and Sexual Activity  ? Alcohol use: No  ? Drug use: No  ? Sexual activity: Not on file  ?Other Topics Concern  ? Not on file  ?Social History Narrative  ? Not on file  ? ?Social Determinants of Health  ? ?Financial Resource Strain: Not on file  ?Food Insecurity: Not on file  ?Transportation Needs: Not on file  ?Physical Activity: Not on file  ?Stress: Not on file  ?Social Connections: Not on file  ?Intimate Partner Violence: Not on file  ?  ?Family History  ?Problem Relation Age of Onset  ? Heart attack Neg Hx   ? Heart disease Neg Hx   ?  ? ? ?Review of systems complete and found to be negative unless listed above  ? ? ?PHYSICAL EXAM ?General: Very pleasant elderly and thin Caucasian female, well nourished, in no acute distress. ?HEENT:  Normocephalic and atraumatic. ?Neck:  No JVD.  ?Lungs: Normal respiratory effort on room air.  Trace crackles bilaterally to auscultation  ?heart: HRRR . Normal S1 and S2 without gallops or murmurs. Radial & DP pulses 2+ bilaterally. ?Abdomen: Non-distended appearing.  ?Msk: Normal strength and tone for age. ?Extremities: Warm and well perfused. No clubbing, cyanosis. trace bilateral lower extremity edema with chronic venous stasis changes .  ?Neuro: Alert and oriented X 3. ?Psych:  Answers questions appropriately.  ? ?Labs: ?  ?Lab Results  ?Component Value Date  ? WBC 5.5 11/16/2021  ? HGB 7.4 (L) 11/16/2021  ? HCT 25.9 (L) 11/16/2021  ? MCV 68.5 (L) 11/16/2021  ? PLT 182 11/16/2021  ?  ?Recent Labs  ?Lab 11/16/21 ?0159  ?NA 132*  ?K 3.3*  ?CL 97*  ?CO2 25  ?BUN 20  ?CREATININE 1.03*  ?CALCIUM 8.4*  ?PROT 6.5  ?BILITOT 1.3*  ?ALKPHOS 69  ?ALT 29  ?AST 18  ?GLUCOSE 190*  ? ?No results found for: CKTOTAL, CKMB, CKMBINDEX, TROPONINI No results found for: CHOL ?No results found for: HDL ?No results found for: [place] ?No results found for: TRIG ?No results  found for: CHOLHDL ?No results found for: LDLDIRECT  ?  ?Radiology: DG Chest 2 View ? ?Result Date: 11/15/2021 ?CLINICAL DATA:  shortness of breath EXAM: CHEST - 2 VIEW COMPARISON:  Chest x-ray 07/18/202

## 2021-11-16 NOTE — Progress Notes (Addendum)
?Progress Note ? ? ?Patient: Emily Jones NID:[id number] DOB: 1941/03/01 DOA: 11/15/2021     0 ?DOS: the patient was seen and examined on 11/16/2021 ?  ?Brief hospital course: ?Taken from H&P. ? ?81 year old female with past medical history of coronary artery disease (S/P STEMI 04/2020 S/P cath with DESplaced in [place], mid left circumflex and proximal left circumflex) diastolic congestive heart failure (Echo 04/2020 EF 50 to 55% with G2 DD), hyperlipidemia, hypertension, gastroesophageal reflux disease who presents to [facility] emergency department with complaints of 1 week of increasing leg swelling and dyspnea on exertion.  She also endorsed some orthopnea and PND. ?No chest pain. ? ?On arrival to ED she was hemodynamically stable but volume overloaded with significant peripheral edema and Rales on examination.  BNP elevated at 1999 and chest x-ray with bilateral trace pleural effusions.  Patient received a one-time dose of IV Lasix 40 mg in ED. ?Patient developed new onset atrial flutter with RVR requiring 15 mg of IV diltiazem and converted back to sinus rhythm.  No prior history of atrial fibrillation or flutter. ? ?TRH was asked for admission due to acute on chronic diastolic heart failure and this new onset intermittent atrial flutter with RVR. ? ?Per patient she was not very compliant with her diet and medications and missing doses quite frequently.  She has not followed up with her cardiologist yet. ? ?Echocardiogram was done this morning-pending results. ? ?She was also found to have iron deficiency anemia due to chronic GI blood loss.  Patient was on DAPT but stating that she was not taking her medications very regularly.  Her PCI is also more than a year ago.  Talked with her cardiologist and they advised to discontinue Brilinta at this time. ?1 dose of IV Feraheme ordered. ?Patient need outpatient GI evaluation. ? ? ?Assessment and Plan: ?* Acute on chronic diastolic (congestive) heart failure (HCC) ?Patient  presenting with pillow orthopnea dyspnea on exertion and peripheral edema with markedly elevated BNP all concerning for acute on chronic diastolic congestive heart failure. ?Continue with IV Lasix 40 mg twice daily ?Strict input and output monitoring ?Supplemental oxygen for bouts of hypoxia ?Monitoring renal function and electrolytes  ?Daily weights ?Echocardiogram done-pending results ?Monitoring patient on telemetry ? ? ? ?Atrial flutter with rapid ventricular response ([place]) ?Patient exhibited a short run of atrial flutter in the emergency department as evidenced by twelve-lead EKG ?Patient was given a one-time dose of intravenous diltiazem causing almost immediate resolution of the atrial flutter ?Patient does not have a history of atrial flutter ?TSH and magnesium within normal limit, mild hypokalemia which is being repleted. ?Resume home regimen of metoprolol ? ? ?Iron deficiency anemia due to chronic blood loss ?Review of patient's hemoglobin shows a substantial downtrend since approximately 1 year ago ?Possibly secondary to chronic iron deficiency anemia from chronic gastrointestinal bleeding however patient admits to me that she has not been consistently taking her aspirin and has run out of Brilinta in the recent past. ?Hemoglobin continue to trend downward, 7.4 this morning.  Anemia panel with quite significant iron deficiency.  No obvious bleeding.  ?Prior EGD and colonoscopy done in 2018 was without any significant abnormality, some internal hemorrhoids noted. ?Give her a dose of Feraheme ?Start her on iron supplement ?Check FOBT ?Patient will need outpatient GI evaluation if having positive FOBT ?Message sent to her cardiologist to discuss the need of DAPT as she is more than 1 year out of her PCI ? ?Uncontrolled type 2 diabetes mellitus  with hyperglycemia, without long-term current use of insulin ([place]) ?Patient been placed on Accu-Cheks before every meal and nightly with sliding scale insulin ?Holding  home regimen of hypoglycemics ?Hemoglobin A1C ordered-pending ?Diabetic Diet  ? ? ?Coronary artery disease involving native coronary artery of native heart without angina pectoris ?Patient is currently chest pain free ?Monitoring patient on telemetry ?Continue home regimen of antiplatelet therapy for now although this may need to be adjusted if hemoglobin is downtrending,s he might not need DAPT now as her PCI is more than a year out, message sent to her cardiologist ?      -Continuing lipid lowering therapy and metoprolol. ? ?Hypokalemia ?Magnesium within normal limit. ?Replacing with oral potassium chloride ?Monitoring potassium levels with serial chemistries ? ? ?Essential hypertension ?Blood pressure currently within goal. ?Continue home dose of metoprolol and low-dose lisinopril.  She is also being diuresed with IV Lasix currently. ?Monitor blood pressure ? ? ? ?Mixed diabetic hyperlipidemia associated with type 2 diabetes mellitus ([place]) ?Continuing home regimen of lipid lowering therapy. ? ? ?GERD without esophagitis ?Patient does not have any obvious bleeding or GERD symptoms. ?Discontinue IV Protonix and place her back on her home regimen. ? ?  ? ?Subjective: Patient was feeling little improved when seen today.  Continues to have some shortness of breath.  No chest pain. ? ?Physical Exam: ?Vitals:  ? 11/16/21 0415 11/16/21 0500 11/16/21 0751 11/16/21 1159  ?BP: (!) 141/71  (!) 141/73 113/71  ?Pulse: 91  93 91  ?Resp: '18  18 18  '$ ?Temp: 98.4 ?F (36.9 ?C)  98.3 ?F (36.8 ?C) 97.7 ?F (36.5 ?C)  ?TempSrc:      ?SpO2: 98%  95% 94%  ?Weight:  65.5 kg    ?Height:      ? ?General.  Frail elderly lady, in no acute distress. ?Pulmonary.  Few basal crackles bilaterally, normal respiratory effort. ?CV.  Regular rate and rhythm, no JVD, rub or murmur. ?Abdomen.  Soft, nontender, nondistended, BS positive. ?CNS.  Alert and oriented .  No focal neurologic deficit. ?Extremities.  1+ LE edema, no cyanosis, pulses intact and  symmetrical. ?Psychiatry.  Judgment and insight appears normal. ? ?Data Reviewed: ?I reviewed prior notes, labs and images. ? ?Family Communication: Discussed with her daughter on phone ? ?Disposition: ?Status is: Inpatient ?Remains inpatient appropriate because: Severity of illness ? ? Planned Discharge Destination: Home with Home Health ? ? ?Time spent: 50  Minutes ? ?This record has been created using Systems analyst. Errors have been sought and corrected,but may not always be located. Such creation errors do not reflect on the standard of care. ? ?Author: ?Lorella Nimrod, MD ?11/16/2021 12:52 PM ? ?For on call review [email or web address].  ?

## 2021-11-16 NOTE — Hospital Course (Addendum)
Taken from H&P. ? ?81 year old female with past medical history of coronary artery disease (S/P STEMI 04/2020 S/P cath with DESplaced in [place], mid left circumflex and proximal left circumflex) diastolic congestive heart failure (Echo 04/2020 EF 50 to 55% with G2 DD), hyperlipidemia, hypertension, gastroesophageal reflux disease who presents to [facility] emergency department with complaints of 1 week of increasing leg swelling and dyspnea on exertion.  She also endorsed some orthopnea and PND. ?No chest pain. ? ?On arrival to ED she was hemodynamically stable but volume overloaded with significant peripheral edema and Rales on examination.  BNP elevated at 1999 and chest x-ray with bilateral trace pleural effusions.  Patient received a one-time dose of IV Lasix 40 mg in ED. ?Patient developed new onset atrial flutter with RVR requiring 15 mg of IV diltiazem and converted back to sinus rhythm.  No prior history of atrial fibrillation or flutter. ? ?TRH was asked for admission due to acute on chronic diastolic heart failure and this new onset intermittent atrial flutter with RVR. ? ?Per patient she was not very compliant with her diet and medications and missing doses quite frequently.  She has not followed up with her cardiologist yet. ? ?Echocardiogram was repeated during current hospitalization-still pending result. ? ?She was also found to have iron deficiency anemia due to chronic GI blood loss.  Patient was on DAPT but stating that she was not taking her medications very regularly.  Her PCI is also more than a year ago.  Talked with her cardiologist and they advised to stop aspirin and Brilinta but started her on Eliquis at 2.5 mg twice daily due to new onset A-fib.  Patient will remain high risk for bleeding. ?Cardiology discussed with patient. ?She received 1 dose of IV Feraheme for iron deficiency anemia.  And also started on p.o. iron supplement.  Patient will need a close monitoring with CBC and iron levels by PCP.   ?Patient also need to see an outpatient gastroenterologist for further evaluation for any occult bleeding.  Prior EGD and colonoscopy in 2018 was without any significant abnormality, internal hemorrhoids were noted.  No obvious bleeding at this time. ? ?Patient was also given prescription of Lasix 20 mg daily as needed to be used with worsening lower extremity edema.  She was instructed to weigh herself daily and if she noticed two pound weight gain in 1 day or 5 pounds in 1 week, she should take 1 dose of Lasix. ? ?Patient will continue with the rest of her home medications and should follow-up with her providers. ? ?

## 2021-11-16 NOTE — Assessment & Plan Note (Addendum)
?   Patient does not have any obvious bleeding or GERD symptoms. ?? Discontinue IV Protonix and place her back on her home regimen. ?

## 2021-11-16 NOTE — Progress Notes (Signed)
*  PRELIMINARY RESULTS* ?Echocardiogram ?2D Echocardiogram has been performed. ? ?[name], [name] ?11/16/2021, 11:28 AM ?

## 2021-11-16 NOTE — Assessment & Plan Note (Addendum)
?   Patient presenting with pillow orthopnea dyspnea on exertion and peripheral edema with markedly elevated BNP all concerning for acute on chronic diastolic congestive heart failure. ?? Continue with IV Lasix 40 mg twice daily ?? Strict input and output monitoring ?? Supplemental oxygen for bouts of hypoxia ?? Monitoring renal function and electrolytes  ?? Daily weights ?? Echocardiogram done-pending results ?? Monitoring patient on telemetry ? ? ?

## 2021-11-17 ENCOUNTER — Other Ambulatory Visit (HOSPITAL_COMMUNITY): Payer: Self-pay

## 2021-11-17 DIAGNOSIS — I5033 Acute on chronic diastolic (congestive) heart failure: Secondary | ICD-10-CM | POA: Diagnosis not present

## 2021-11-17 LAB — CBC
HCT: 25.3 % — ABNORMAL LOW (ref 36.0–46.0)
Hemoglobin: 7.3 g/dL — ABNORMAL LOW (ref 12.0–15.0)
MCH: 19.4 pg — ABNORMAL LOW (ref 26.0–34.0)
MCHC: 28.9 g/dL — ABNORMAL LOW (ref 30.0–36.0)
MCV: 67.3 fL — ABNORMAL LOW (ref 80.0–100.0)
Platelets: 183 10*3/uL (ref 150–400)
RBC: 3.76 MIL/uL — ABNORMAL LOW (ref 3.87–5.11)
RDW: 18.8 % — ABNORMAL HIGH (ref 11.5–15.5)
WBC: 4 10*3/uL (ref 4.0–10.5)
nRBC: 0 % (ref 0.0–0.2)

## 2021-11-17 LAB — ECHOCARDIOGRAM COMPLETE
AR max vel: 2.94 cm2
AV Area VTI: 3.2 cm2
AV Area mean vel: 3.16 cm2
AV Mean grad: 3.5 mmHg
AV Peak grad: 6.2 mmHg
Ao pk vel: 1.24 m/s
Area-P 1/2: 4.52 cm2
Calc EF: 45.6 %
Height: 65 in
MV M vel: 5.13 m/s
MV Peak grad: 105.3 mmHg
MV VTI: 2.08 cm2
S' Lateral: 4.3 cm
Single Plane A2C EF: 45 %
Single Plane A4C EF: 44.5 %
Weight: 2310.4 oz

## 2021-11-17 LAB — BASIC METABOLIC PANEL
Anion gap: 8 (ref 5–15)
BUN: 17 mg/dL (ref 8–23)
CO2: 30 mmol/L (ref 22–32)
Calcium: 8.4 mg/dL — ABNORMAL LOW (ref 8.9–10.3)
Chloride: 100 mmol/L (ref 98–111)
Creatinine, Ser: 1.14 mg/dL — ABNORMAL HIGH (ref 0.44–1.00)
GFR, Estimated: 49 mL/min — ABNORMAL LOW (ref >60)
Glucose, Bld: 199 mg/dL — ABNORMAL HIGH (ref 70–99)
Potassium: 3.3 mmol/L — ABNORMAL LOW (ref 3.5–5.1)
Sodium: 138 mmol/L (ref 135–145)

## 2021-11-17 LAB — GLUCOSE, CAPILLARY
Glucose-Capillary: 163 mg/dL — ABNORMAL HIGH (ref 70–99)
Glucose-Capillary: 149 mg/dL — ABNORMAL HIGH (ref 70–99)

## 2021-11-17 LAB — HEMOGLOBIN A1C
Hgb A1c MFr Bld: 10.5 % — ABNORMAL HIGH (ref 4.8–5.6)
Mean Plasma Glucose: 255 mg/dL

## 2021-11-17 MED ORDER — APIXABAN 2.5 MG PO TABS: 2.5000 mg | ORAL_TABLET | Freq: Two times a day (BID) | ORAL | 1 refills | Status: DC

## 2021-11-17 MED ORDER — FERROUS SULFATE 325 (65 FE) MG PO TABS: 325.0000 mg | ORAL_TABLET | Freq: Two times a day (BID) | ORAL | 3 refills | Status: DC

## 2021-11-17 MED ORDER — POLYETHYLENE GLYCOL 3350 17 G PO PACK: 17.0000 g | PACK | Freq: Every day | ORAL | 0 refills | Status: DC | PRN

## 2021-11-17 MED ORDER — FUROSEMIDE 20 MG PO TABS: 20.0000 mg | ORAL_TABLET | Freq: Every day | ORAL | 11 refills | Status: AC | PRN

## 2021-11-17 MED ORDER — FERROUS SULFATE 325 (65 FE) MG PO TABS: 325.0000 mg | ORAL_TABLET | Freq: Two times a day (BID) | ORAL | Status: DC

## 2021-11-17 MED ORDER — APIXABAN 2.5 MG PO TABS
2.5000 mg | ORAL_TABLET | Freq: Two times a day (BID) | ORAL | Status: DC
  Administered 2021-11-17: 2.5 mg via ORAL
  Filled 2021-11-17: qty 1

## 2021-11-17 MED ORDER — EMPAGLIFLOZIN 10 MG PO TABS: 10.0000 mg | ORAL_TABLET | Freq: Every day | ORAL | 1 refills | Status: DC

## 2021-11-17 MED ORDER — POTASSIUM CHLORIDE CRYS ER 20 MEQ PO TBCR
40.0000 meq | EXTENDED_RELEASE_TABLET | Freq: Once | ORAL | Status: AC
Start: 2021-11-17 — End: 2021-11-17
  Administered 2021-11-17: 40 meq via ORAL
  Filled 2021-11-17: qty 2

## 2021-11-17 MED ORDER — LISINOPRIL 5 MG PO TABS
2.5000 mg | ORAL_TABLET | Freq: Every day | ORAL | Status: DC
  Administered 2021-11-17: 2.5 mg via ORAL
  Filled 2021-11-17: qty 1

## 2021-11-17 NOTE — Progress Notes (Addendum)
Mobility Specialist - Progress Note ? ? 11/17/21 1200  ?Mobility  ?Activity Ambulated with assistance in hallway;Transferred from bed to chair  ?Level of Assistance Standby assist, set-up cues, supervision of patient - no hands on  ?Assistive Device Front wheel walker  ?Distance Ambulated (ft) 45 ft  ?Activity Response Tolerated well  ?$Mobility charge 1 Mobility  ? ? ?Pre-mobility: 81 HR, 95% SpO2 ?During mobility: 100 HR, 96% SpO2 ?Post-mobility: 85 HR, 97% SpO2 ? ? ?Pt lying in bed upon arrival, utilizing RA. Pt having difficulty swallowing pills with liquids, took ~10-15 minutes to swallow about 10 small pills. RN made aware. Pt modI for bed mobility. Ambulated in hallway with supervision, 2 seated rest breaks per pt request---approximately every 15'. Voiced feeling winded with minimal activity, O2 sats maintained upper 90s. VC to keep RW close and within BOS especially during turns---anterior lean; pt reports using rollator PTA. Pt returned to recliner with alarm set, needs in reach. RN notified of performance.  ? ? ?Kathee Delton ?Mobility Specialist ?11/17/21, 12:20 PM ? ? ?

## 2021-11-17 NOTE — TOC CM/SW Note (Signed)
TOC acknowledges consult for heart failure screening and SNF placement. ?Per chart review, Heart Failure Navigator has already completed heart failure screening. ?Please place PT/OT consults when/if needed and TOC can follow up on their recommendations. ? Oleh Genin, LCSW ?[phone] ? ?

## 2021-11-17 NOTE — Progress Notes (Signed)
?[facility] CARDIOLOGY CONSULT NOTE  ? ?    ?Patient ID: ?Emily Jones ?MRN: [id number] ?DOB/AGE: May 13, 1941 81 y.o. ? ?Admit date: 11/15/2021 ?Referring Physician Dr. Lorella Nimrod ?Primary Physician Dr. [place] Pink ?Primary Cardiologist Dr. Saralyn Pilar ?Reason for Consultation acute on chronic HFpEF ? ?HPI: The patient is an 81 year old female with a past medical history of CAD with inferior STEMI 05/20/2020 s/p PCI with DES x3 to OM1, mid LCx, proximal LCx, HFpEF LVEF 50-55%, G2 DD with biatrial mild dilation, moderate MR 04/2020) hypertension, hyperlipidemia, type 2 diabetes, GERD, depression who presented to [facility] ED 11/15/2021 with worsening shortness of breath, exertional dyspnea, and orthopnea.  She has reportedly been taking aspirin and Brilinta since her STEMI in 2021 but has not been seen by outpatient cardiology since her stent placements. Cardiology is consulted for further assistance with diuresis and new onset atrial fibrillation in the ED.  ? ?Interval history ?-Feels like her breathing has improved, less short of breath, leg swelling much improved. ?-No chest pain, palpitations ?-Long discussion with patient about limiting sodium, ice intake, and heart healthy diet. ? ?Review of systems complete and found to be negative unless listed above  ? ? ? ?Past Medical History:  ?Diagnosis Date  ? Allergic state   ? Anxiety   ? CHF (congestive heart failure) ([place])   ? Coronary artery disease   ? Costochondritis   ? Depression   ? Diabetes mellitus without complication ([place])   ? Esophagitis   ? GERD (gastroesophageal reflux disease)   ? Headache   ? Hemorrhoids   ? Hyperlipidemia   ? Hypertension   ? Panic attacks   ?  ?Past Surgical History:  ?Procedure Laterality Date  ? APPENDECTOMY    ? COLONOSCOPY N/A 03/29/2017  ? Procedure: COLONOSCOPY;  Surgeon: Manya Silvas, MD;  Location[facility] ENDOSCOPY;  Service: Endoscopy;  Laterality: N/A;  ? CORONARY/GRAFT ACUTE MI REVASCULARIZATION N/A 05/20/2020  ? Procedure:  Coronary/Graft Acute MI Revascularization;  Surgeon: Isaias Cowman, MD;  Location: [place] CV LAB;  Service: Cardiovascular;  Laterality: N/A;  ? DILATION AND CURETTAGE, DIAGNOSTIC / THERAPEUTIC    ? ESOPHAGOGASTRODUODENOSCOPY (EGD) WITH PROPOFOL N/A 03/29/2017  ? Procedure: ESOPHAGOGASTRODUODENOSCOPY (EGD) WITH PROPOFOL;  Surgeon: Manya Silvas, MD;  Location: [facility] ENDOSCOPY;  Service: Endoscopy;  Laterality: N/A;  ? KNEE SURGERY    ? LEFT HEART CATH AND CORONARY ANGIOGRAPHY N/A 05/20/2020  ? Procedure: LEFT HEART CATH AND CORONARY ANGIOGRAPHY;  Surgeon: Isaias Cowman, MD;  Location: [place] [facility] CV LAB;  Service: Cardiovascular;  Laterality: N/A;  ?  ?Medications Prior to Admission  ?Medication Sig Dispense Refill Last Dose  ? clonazePAM (KLONOPIN) 0.5 MG tablet Take 0.25 mg by mouth daily.   11/15/2021  ? lisinopril (ZESTRIL) 2.5 MG tablet Take 1 tablet (2.5 mg total) by mouth daily. 90 tablet 3 11/15/2021  ? metoprolol tartrate (LOPRESSOR) 25 MG tablet Take 1 tablet (25 mg total) by mouth 2 (two) times daily. 60 tablet 5 11/15/2021  ? omeprazole (PRILOSEC) 20 MG capsule Take 20 mg by mouth daily.   11/15/2021  ? sertraline (ZOLOFT) 100 MG tablet Take 100 mg by mouth daily.   11/15/2021  ? acetaminophen (TYLENOL) 325 MG tablet Take 650 mg by mouth every 6 (six) hours as needed for mild pain.   prn at unknown  ? aspirin 81 MG chewable tablet Chew 1 tablet (81 mg total) by mouth daily. (Patient not taking: Reported on 11/16/2021) 90 tablet 3 Not Taking  ? atorvastatin (  LIPITOR) 80 MG tablet Take 1 tablet (80 mg total) by mouth daily. 90 tablet 3 11/14/2021  ? empagliflozin (JARDIANCE) 10 MG TABS tablet Take 1 tablet (10 mg total) by mouth daily before breakfast. (Patient not taking: Reported on 06/09/2021) 14 tablet 0   ? nitroGLYCERIN (NITROSTAT) 0.4 MG SL tablet Place 1 tablet (0.4 mg total) under the tongue every 5 (five) minutes as needed for chest pain. 20 tablet 12 prn at unknown  ? ticagrelor  (BRILINTA) 90 MG TABS tablet Take 1 tablet (90 mg total) by mouth 2 (two) times daily. (Patient not taking: Reported on 11/16/2021) 60 tablet 1 Not Taking  ? ?Social History  ? ?Socioeconomic History  ? Marital status: Married  ?  Spouse name: Not on file  ? Number of children: Not on file  ? Years of education: Not on file  ? Highest education level: Not on file  ?Occupational History  ? Not on file  ?Tobacco Use  ? Smoking status: Never  ? Smokeless tobacco: Never  ?Substance and Sexual Activity  ? Alcohol use: No  ? Drug use: No  ? Sexual activity: Not on file  ?Other Topics Concern  ? Not on file  ?Social History Narrative  ? Not on file  ? ?Social Determinants of Health  ? ?Financial Resource Strain: Not on file  ?Food Insecurity: Not on file  ?Transportation Needs: Not on file  ?Physical Activity: Not on file  ?Stress: Not on file  ?Social Connections: Not on file  ?Intimate Partner Violence: Not on file  ?  ?Family History  ?Problem Relation Age of Onset  ? Heart attack Neg Hx   ? Heart disease Neg Hx   ?  ? ? ?Review of systems complete and found to be negative unless listed above  ? ? ?PHYSICAL EXAM ?General: Very pleasant elderly and thin Caucasian female, well nourished, in no acute distress. ?HEENT:  Normocephalic and atraumatic. ?Neck:  No JVD.  ?Lungs: Normal respiratory effort on room air.  Trace crackles bilaterally to auscultation  ?heart: HRRR . Normal S1 and S2 without gallops or murmurs. Radial & DP pulses 2+ bilaterally. ?Abdomen: Non-distended appearing.  ?Msk: Normal strength and tone for age. ?Extremities: Warm and well perfused. No clubbing, cyanosis. trace bilateral lower extremity edema with chronic venous stasis changes, improving ?Neuro: Alert and oriented X 3. ?Psych:  Answers questions appropriately.  ? ?Labs: ?  ?Lab Results  ?Component Value Date  ? WBC 4.0 11/17/2021  ? HGB 7.3 (L) 11/17/2021  ? HCT 25.3 (L) 11/17/2021  ? MCV 67.3 (L) 11/17/2021  ? PLT 183 11/17/2021  ?  ?Recent Labs   ?Lab 11/16/21 ?0159 11/17/21 ?2130  ?NA 132* 138  ?K 3.3* 3.3*  ?CL 97* 100  ?CO2 25 30  ?BUN 20 17  ?CREATININE 1.03* 1.14*  ?CALCIUM 8.4* 8.4*  ?PROT 6.5  --   ?BILITOT 1.3*  --   ?ALKPHOS 69  --   ?ALT 29  --   ?AST 18  --   ?GLUCOSE 190* 199*  ? ? ?No results found for: CKTOTAL, CKMB, CKMBINDEX, TROPONINI No results found for: CHOL ?No results found for: HDL ?No results found for: [place] ?No results found for: TRIG ?No results found for: CHOLHDL ?No results found for: LDLDIRECT  ?  ?Radiology: DG Chest 2 View ? ?Result Date: 11/15/2021 ?CLINICAL DATA:  shortness of breath EXAM: CHEST - 2 VIEW COMPARISON:  Chest x-ray 03/13/2021, CT angiography chest 03/13/2021 FINDINGS: The heart and mediastinal  contours are unchanged. Aortic calcification. No focal consolidation. No pulmonary edema. Bilateral small pleural effusions. No pneumothorax. No acute osseous abnormality. IMPRESSION: Bilateral small trace pleural effusions Electronically Signed   By: Iven Finn M.D.   On: 11/15/2021 19:25  ? ?CT Angio Chest Pulmonary Embolism (PE) W or WO Contrast ? ?Result Date: 11/16/2021 ?CLINICAL DATA:  Pulmonary embolism (PE) suspected, positive D-dimer. Dyspnea, leg swelling. EXAM: CT ANGIOGRAPHY CHEST WITH CONTRAST TECHNIQUE: Multidetector CT imaging of the chest was performed using the standard protocol during bolus administration of intravenous contrast. Multiplanar CT image reconstructions and MIPs were obtained to evaluate the vascular anatomy. RADIATION DOSE REDUCTION: This exam was performed according to the departmental dose-optimization program which includes automated exposure control, adjustment of the mA and/or kV according to patient size and/or use of iterative reconstruction technique. CONTRAST:  50m OMNIPAQUE IOHEXOL 350 MG/ML SOLN COMPARISON:  None. FINDINGS: Cardiovascular: Adequate opacification of the pulmonary arterial tree. No intraluminal filling defect identified to suggest acute pulmonary embolism.  The central pulmonary arteries are enlarged in keeping with changes of pulmonary arterial hypertension. Moderate multi-vessel coronary artery calcification with stenting of the proximal left circumfl

## 2021-11-17 NOTE — TOC Initial Note (Addendum)
Transition of Care (TOC) - Initial/Assessment Note  ? ? ?Patient Details  ?Name: Emily Jones ?MRN: [id number] ?Date of Birth: 08/19/1941 ? ?Transition of Care (TOC) CM/SW Contact:    ?Haniyah Maciolek E Rhys Anchondo, LCSW ?Phone Number: ?11/17/2021, 12:43 PM ? ?Clinical Narrative:            Patient to DC home today, needs HH per rounds.  ?Spoke to patient who stated she will be staying with her ex husband at [address], [place], [place] at time of DC. ?Daughter will pick up at time of DC. Daughter or ex husband provide transportaton.  ?PCP is Dr. Kary Kos but patient is looking at switching. Pharmacy is [facility]. Patient has a RW.  ?Patient is agreeable to [facility]. Tommi Rumps with [facility] accepted referral. Patient requested [facility] call her at [phone] for scheduling.     ? ? ?Expected Discharge Plan: [place] ?Barriers to Discharge: Barriers Resolved ? ? ?Patient Goals and CMS Choice ?Patient states their goals for this hospitalization and ongoing recovery are:: home with home health ?CMS Medicare.gov Compare Post Acute Care list provided to:: Patient ?Choice offered to / list presented to : Patient ? ?Expected Discharge Plan and Services ?Expected Discharge Plan: [place] ?  ?  ?  ?Living arrangements for the past 2 months: [place] ?Expected Discharge Date: 11/17/21               ?  ?  ?  ?  ?  ?HH Arranged: Therapist, sports, PT ?[place] ?Date HH Agency Contacted: 11/17/21 ?  ?Representative spoke with at [place]: Corene Cornea ? ?Prior Living Arrangements/Services ?Living arrangements for the past 2 months: [place] ?Lives with:: Relatives ?Patient language and need for interpreter reviewed:: Yes ?Do you feel safe going back to the place where you live?: Yes      ?Need for Family Participation in Patient Care: Yes (Comment) ?Care giver support system in place?: Yes (comment) ?Current home services: DME ?Criminal Activity/Legal Involvement Pertinent to Current  Situation/Hospitalization: No - Comment as needed ? ?Activities of Daily Living ?Home Assistive Devices/Equipment: None ?ADL Screening (condition at time of admission) ?Patient's cognitive ability adequate to safely complete daily activities?: Yes ?Is the patient deaf or have difficulty hearing?: No ?Does the patient have difficulty seeing, even when wearing glasses/contacts?: No ?Does the patient have difficulty concentrating, remembering, or making decisions?: Yes ?Patient able to express need for assistance with ADLs?: Yes ?Does the patient have difficulty dressing or bathing?: Yes ?Independently performs ADLs?: No ?Does the patient have difficulty walking or climbing stairs?: Yes ?Weakness of Legs: Both ?Weakness of Arms/Hands: None ? ?Permission Sought/Granted ?Permission sought to share information with : Family Supports, Customer service manager ?Permission granted to share information with : Yes, Verbal Permission Granted ?   ? Permission granted to share info w AGENCY: [place] agencies ?   ?   ? ?Emotional Assessment ?  ?  ?  ?Orientation: : Oriented to Self, Oriented to Place, Oriented to  Time, Oriented to Situation ?Alcohol / Substance Use: Not Applicable ?Psych Involvement: No (comment) ? ?Admission diagnosis:  Acute on chronic diastolic (congestive) heart failure ([place]) [I50.33] ?Atrial flutter, unspecified type ([place]) [I48.92] ?New onset of congestive heart failure ([place]) [I50.9] ?Acute on chronic heart failure ([place]) [I50.9] ?Patient Active Problem List  ? Diagnosis Date Noted  ? Atrial flutter with rapid ventricular response ([place]) 11/16/2021  ? Uncontrolled type 2 diabetes mellitus with hyperglycemia,  without long-term current use of insulin ([place]) 11/16/2021  ? Hypokalemia 11/16/2021  ? Iron deficiency anemia due to chronic blood loss 11/16/2021  ? Acute on chronic heart failure ([place]) 11/16/2021  ? Acute on chronic diastolic (congestive) heart failure ([place]) 11/15/2021  ? Coronary artery disease  involving native coronary artery of native heart without angina pectoris 11/15/2021  ? GERD without esophagitis 11/15/2021  ? Mixed diabetic hyperlipidemia associated with type 2 diabetes mellitus ([place]) 11/15/2021  ? Essential hypertension 11/15/2021  ? ?PCP:  Pcp, No ?Pharmacy:   ?[facility] [place] [address] ?[place] [address] ?Phone: [phone] Fax: [phone] ? ? ? ? ?Social Determinants of Health (SDOH) Interventions ?  ? ?Readmission Risk Interventions ?   ? View : No data to display.  ?  ?  ?  ? ? ? ?

## 2021-11-17 NOTE — Progress Notes (Signed)
Patient's rhytm recheked. Patient is Normal Sinus Rhythm ?

## 2021-11-17 NOTE — Discharge Summary (Signed)
?Physician Discharge Summary ?  ?Patient: Emily Jones MRN: [id number] DOB: 08/28/1940  ?Admit date:     11/15/2021  ?Discharge date: 11/17/21  ?Discharge Physician: Lorella Nimrod  ? ?PCP: Pcp, No  ? ?Recommendations at discharge:  ?Please obtain CBC and BMP in 1 week ?Follow-up on echocardiogram result. ?Patient was started on iron supplement, no obvious bleeding but she need a work-up for any occult bleeding.  Patient need to follow-up with gastroenterology for further recommendation. ?She is also been started on Eliquis due to new onset A-fib.  Aspirin and Brilinta was discontinued to decrease the risk of bleeding. ?Patient need monitoring of CBC and iron levels. ?Follow-up with cardiology in 1 week ?Follow-up with primary care provider in 1 week. ? ?Discharge Diagnoses: ?Principal Problem: ?  Acute on chronic diastolic (congestive) heart failure (HCC) ?Active Problems: ?  Atrial flutter with rapid ventricular response ([place]) ?  Iron deficiency anemia due to chronic blood loss ?  Uncontrolled type 2 diabetes mellitus with hyperglycemia, without long-term current use of insulin ([place]) ?  Coronary artery disease involving native coronary artery of native heart without angina pectoris ?  Hypokalemia ?  Essential hypertension ?  Mixed diabetic hyperlipidemia associated with type 2 diabetes mellitus ([place]) ?  GERD without esophagitis ?  Acute on chronic heart failure ([place]) ? ? ?Hospital Course: ?Taken from H&P. ? ?81 year old female with past medical history of coronary artery disease (S/P STEMI 04/2020 S/P cath with DESplaced in [place], mid left circumflex and proximal left circumflex) diastolic congestive heart failure ([place] 04/2020 EF 50 to 55% with G2 DD), hyperlipidemia, hypertension, gastroesophageal reflux disease who presents to [facility] emergency department with complaints of 1 week of increasing leg swelling and dyspnea on exertion.  She also endorsed some orthopnea and PND. ?No chest pain. ? ?On arrival to ED she was  hemodynamically stable but volume overloaded with significant peripheral edema and Rales on examination.  BNP elevated at 1999 and chest x-ray with bilateral trace pleural effusions.  Patient received a one-time dose of IV Lasix 40 mg in ED. ?Patient developed new onset atrial flutter with RVR requiring 15 mg of IV diltiazem and converted back to sinus rhythm.  No prior history of atrial fibrillation or flutter. ? ?TRH was asked for admission due to acute on chronic diastolic heart failure and this new onset intermittent atrial flutter with RVR. ? ?Per patient she was not very compliant with her diet and medications and missing doses quite frequently.  She has not followed up with her cardiologist yet. ? ?Echocardiogram was repeated during current hospitalization-still pending result. ? ?She was also found to have iron deficiency anemia due to chronic GI blood loss.  Patient was on DAPT but stating that she was not taking her medications very regularly.  Her PCI is also more than a year ago.  Talked with her cardiologist and they advised to stop aspirin and Brilinta but started her on Eliquis at 2.5 mg twice daily due to new onset A-fib.  Patient will remain high risk for bleeding. ?Cardiology discussed with patient. ?She received 1 dose of IV Feraheme for iron deficiency anemia.  And also started on p.o. iron supplement.  Patient will need a close monitoring with CBC and iron levels by PCP.  ?Patient also need to see an outpatient gastroenterologist for further evaluation for any occult bleeding.  Prior EGD and colonoscopy in 2018 was without any significant abnormality, internal hemorrhoids were noted.  No obvious bleeding at this time. ? ?Patient  was also given prescription of Lasix 20 mg daily as needed to be used with worsening lower extremity edema.  She was instructed to weigh herself daily and if she noticed two pound weight gain in 1 day or 5 pounds in 1 week, she should take 1 dose of Lasix. ? ?Patient  will continue with the rest of her home medications and should follow-up with her providers. ? ? ?Assessment and Plan: ?* Acute on chronic diastolic (congestive) heart failure (HCC) ?Patient presenting with pillow orthopnea dyspnea on exertion and peripheral edema with markedly elevated BNP all concerning for acute on chronic diastolic congestive heart failure. ?Continue with IV Lasix 40 mg twice daily ?Strict input and output monitoring ?Supplemental oxygen for bouts of hypoxia ?Monitoring renal function and electrolytes  ?Daily weights ?Echocardiogram done-pending results ?Monitoring patient on telemetry ? ? ? ?Atrial flutter with rapid ventricular response ([place]) ?Patient exhibited a short run of atrial flutter in the emergency department as evidenced by twelve-lead EKG ?Patient was given a one-time dose of intravenous diltiazem causing almost immediate resolution of the atrial flutter ?Patient does not have a history of atrial flutter ?TSH and magnesium within normal limit, mild hypokalemia which is being repleted. ?Resume home regimen of metoprolol ? ? ?Iron deficiency anemia due to chronic blood loss ?Review of patient's hemoglobin shows a substantial downtrend since approximately 1 year ago ?Possibly secondary to chronic iron deficiency anemia from chronic gastrointestinal bleeding however patient admits to me that she has not been consistently taking her aspirin and has run out of Brilinta in the recent past. ?Hemoglobin continue to trend downward, 7.4 this morning.  Anemia panel with quite significant iron deficiency.  No obvious bleeding.  ?Prior EGD and colonoscopy done in 2018 was without any significant abnormality, some internal hemorrhoids noted. ?Give her a dose of Feraheme ?Start her on iron supplement ?Check FOBT ?Patient will need outpatient GI evaluation if having positive FOBT ?Message sent to her cardiologist to discuss the need of DAPT as she is more than 1 year out of her PCI ? ?Uncontrolled  type 2 diabetes mellitus with hyperglycemia, without long-term current use of insulin ([place]) ?Patient been placed on Accu-Cheks before every meal and nightly with sliding scale insulin ?Holding home regimen of hypoglycemics ?Hemoglobin A1C ordered-pending ?Diabetic Diet  ? ? ?Coronary artery disease involving native coronary artery of native heart without angina pectoris ?Patient is currently chest pain free ?Monitoring patient on telemetry ?Continue home regimen of antiplatelet therapy for now although this may need to be adjusted if hemoglobin is downtrending,s he might not need DAPT now as her PCI is more than a year out, message sent to her cardiologist ?      -Continuing lipid lowering therapy and metoprolol. ? ?Hypokalemia ?Magnesium within normal limit. ?Replacing with oral potassium chloride ?Monitoring potassium levels with serial chemistries ? ? ?Essential hypertension ?Blood pressure currently within goal. ?Continue home dose of metoprolol and low-dose lisinopril.  She is also being diuresed with IV Lasix currently. ?Monitor blood pressure ? ? ? ?Mixed diabetic hyperlipidemia associated with type 2 diabetes mellitus ([place]) ?Continuing home regimen of lipid lowering therapy. ? ? ?GERD without esophagitis ?Patient does not have any obvious bleeding or GERD symptoms. ?Discontinue IV Protonix and place her back on her home regimen. ? ? ?Consultants: Cardiology ?Procedures performed: None ?Disposition: Home ?Diet recommendation:  ?Discharge Diet Orders (From admission, onward)  ? ?  Start     Ordered  ? 11/17/21 0000  Diet - low sodium heart  healthy       ? 11/17/21 1219  ? ?  ?  ? ?  ? ?Cardiac and Carb modified diet ?DISCHARGE MEDICATION: ?Allergies as of 11/17/2021   ? ?   Reactions  ? Aspirin   ? Chlorpheniramine Maleate   ? Codeine   ? Statins Other (See Comments)  ? Muscle Spasms  ? Sulfa Antibiotics   ? Penicillins Rash  ? ?  ? ?  ?Medication List  ?  ? ?STOP taking these medications   ? ?aspirin 81 MG  chewable tablet ?  ?ticagrelor 90 MG Tabs tablet ?Commonly known as: BRILINTA ?  ? ?  ? ?TAKE these medications   ? ?acetaminophen 325 MG tablet ?Commonly known as: TYLENOL ?Take 650 mg by mouth every 6 (six) h

## 2021-11-17 NOTE — TOC Benefit Eligibility Note (Signed)
Patient Advocate Encounter ? ?Insurance verification completed.   ? ?The patient is currently admitted and upon discharge could be taking Eliquis 2.5 mg. ? ?The current 30 day co-pay is, $45.00.  ? ?The patient is insured through [employer] Medicare Part D  ? ? ? ?Lyndel Safe, CPhT ?Pharmacy Patient Advocate Specialist ?[place] Patient Advocate Team ?Direct Number: [phone]  Fax: [phone] ? ? ? ? ? ?  ?

## 2021-11-17 NOTE — Progress Notes (Signed)
? ?  Heart Failure Nurse Navigator Note ? ?Met with patient today, she was lying in bed in no acute distress. ? ?By teach back method went over low-sodium, fluids to avoid, fluid restriction and daily weights.  Needed little enforcement. ? ?Also talked about what to do if she wanted to go out for that barbecue sandwich.  Discussed only eating half the sandwich at a sitting, to be mindful of her sodium intake for the rest of the day.  She voices understanding ? ?She had no further questions. ? ?Pricilla Riffle RN CHFN ? ? ?

## 2021-11-17 NOTE — Progress Notes (Signed)
Patient went into A-fib at 11:30 pm. Patient  was assessed in room and appeared asymptomatic. No shortness of breath or difficulty/labored breathing. Vital signs were stable except for Blood Pressure which is high at baseline. No complaints of pain. See Flowsheets. ? ? ?

## 2021-11-17 NOTE — Progress Notes (Signed)
Inpatient Diabetes Program Recommendations ? ?AACE/ADA: New Consensus Statement on Inpatient Glycemic Control (2015) ? ?Target Ranges:  Prepandial:   less than 140 mg/dL ?     Peak postprandial:   less than 180 mg/dL (1-2 hours) ?     Critically ill patients:  140 - 180 mg/dL  ? ?Lab Results  ?Component Value Date  ? GLUCAP 163 (H) 11/17/2021  ? HGBA1C 10.5 (H) 11/16/2021  ? ? ?Review of Glycemic Control ? ?Diabetes history: DM 2 diagnosed September 2021 ?Outpatient Diabetes medications: Jardiance in the past but per pt report could not tolerate side effects (she did not know it was also used for glucose control) ?Current orders for Inpatient glycemic control:  ?Novolog 0-15 units tid + hs ? ?A1c 10.5% ? ? ?Spoke with pt at bedside. Pt was diagnosed with Diabetes September 2021 and was placed on Jardiance, though at the time pt did not realized that it also lowered glucose. Pt could not tolerate side effects and stopped taking medication. Pt only has a cardiologist. Pt does not have a PCP. Pt reports changing some of her lifestyle but also states she did not change it enough. Discussed current A1c. Discussed glucose and A1c goals. Spent a lot of time reviewing and teaching pt about diet modification. Reviewed hypoglycemia s/s and treatment. Pt desires to start oral meds again with lifestyle modification and to follow up with a PCP (when she finds one) for Diabetes management. ? ?Pt wants to learn how to check her glucose prior to being discharged ? ?Glucose meter when going home if not going to SNF ? ?Thanks, ? ?Tama Headings RN, MSN, BC-ADM ?Inpatient Diabetes Coordinator ?Team Pager [phone] (8a-5p) ? ?

## 2021-11-21 ENCOUNTER — Telehealth: Payer: Self-pay

## 2021-11-21 DIAGNOSIS — I4892 Unspecified atrial flutter: Secondary | ICD-10-CM | POA: Diagnosis not present

## 2021-11-23 ENCOUNTER — Ambulatory Visit: Payer: PPO | Admitting: Family

## 2021-11-23 DIAGNOSIS — E785 Hyperlipidemia, unspecified: Secondary | ICD-10-CM | POA: Diagnosis not present

## 2021-11-23 DIAGNOSIS — E119 Type 2 diabetes mellitus without complications: Secondary | ICD-10-CM | POA: Diagnosis not present

## 2021-11-23 DIAGNOSIS — I2119 ST elevation (STEMI) myocardial infarction involving other coronary artery of inferior wall: Secondary | ICD-10-CM | POA: Diagnosis not present

## 2021-11-23 DIAGNOSIS — I10 Essential (primary) hypertension: Secondary | ICD-10-CM | POA: Diagnosis not present

## 2021-12-19 ENCOUNTER — Encounter: Payer: Self-pay | Admitting: Family

## 2021-12-19 ENCOUNTER — Ambulatory Visit: Payer: PPO | Attending: Family | Admitting: Family

## 2021-12-19 VITALS — BP 117/65 | HR 75 | Resp 14 | Ht 65.0 in | Wt 117.1 lb

## 2021-12-19 DIAGNOSIS — E119 Type 2 diabetes mellitus without complications: Secondary | ICD-10-CM | POA: Insufficient documentation

## 2021-12-19 DIAGNOSIS — I2510 Atherosclerotic heart disease of native coronary artery without angina pectoris: Secondary | ICD-10-CM | POA: Diagnosis not present

## 2021-12-19 DIAGNOSIS — I10 Essential (primary) hypertension: Secondary | ICD-10-CM | POA: Diagnosis not present

## 2021-12-19 DIAGNOSIS — Z79899 Other long term (current) drug therapy: Secondary | ICD-10-CM | POA: Diagnosis not present

## 2021-12-19 DIAGNOSIS — K2100 Gastro-esophageal reflux disease with esophagitis, without bleeding: Secondary | ICD-10-CM | POA: Diagnosis not present

## 2021-12-19 DIAGNOSIS — F419 Anxiety disorder, unspecified: Secondary | ICD-10-CM

## 2021-12-19 DIAGNOSIS — I5032 Chronic diastolic (congestive) heart failure: Secondary | ICD-10-CM | POA: Diagnosis not present

## 2021-12-19 DIAGNOSIS — I5022 Chronic systolic (congestive) heart failure: Secondary | ICD-10-CM | POA: Insufficient documentation

## 2021-12-19 DIAGNOSIS — E118 Type 2 diabetes mellitus with unspecified complications: Secondary | ICD-10-CM | POA: Diagnosis not present

## 2021-12-19 DIAGNOSIS — Z7984 Long term (current) use of oral hypoglycemic drugs: Secondary | ICD-10-CM | POA: Diagnosis not present

## 2021-12-19 DIAGNOSIS — I110 Hypertensive heart disease with heart failure: Secondary | ICD-10-CM | POA: Insufficient documentation

## 2021-12-19 DIAGNOSIS — I252 Old myocardial infarction: Secondary | ICD-10-CM | POA: Insufficient documentation

## 2021-12-19 DIAGNOSIS — Z955 Presence of coronary angioplasty implant and graft: Secondary | ICD-10-CM | POA: Insufficient documentation

## 2021-12-19 DIAGNOSIS — E785 Hyperlipidemia, unspecified: Secondary | ICD-10-CM | POA: Diagnosis not present

## 2021-12-19 NOTE — Progress Notes (Signed)
? Patient ID: Emily Jones, female    DOB: Aug 16, 1941, 81 y.o.   MRN: [id number] ? ?HPI ? ?[name] is a 81 y/o female with a history of CAD, DM, hyperlipidemia, HTN, anxiety, depression, GERD, panic attacks and chronic heart failure.  ? ?Echo report from 11/16/21 reviewed and showed an EF of 45-50% along with mild LVH and moderate MR. Echo report from 05/20/20 reviewed and showed an EF of 50-55% along with mild LAE and moderate MR.  ? ?LHC done 05/20/20 showed: ?1st Mrg lesion is 100% stenosed. ?Mid Cx lesion is 95% stenosed. ?Ost Cx to Prox Cx lesion is 90% stenosed. ?Mid LAD lesion is 50% stenosed. ?Mid LAD to Dist LAD lesion is 40% stenosed. ?Prox RCA lesion is 30% stenosed. ?Dist RCA lesion is 40% stenosed. ?RPAV lesion is 50% stenosed. ?A drug-eluting stent was successfully placed using a STENT RESOLUTE ONYX 2.0X15. ?Post intervention, there is a 0% residual stenosis. ?A drug-eluting stent was successfully placed using a [place] 2.70J50. ?Post intervention, there is a 0% residual stenosis. ?A drug-eluting stent was successfully placed using a [place] [id number]. ?Post intervention, there is a 0% residual stenosis. ?  ?1.  Inferior STEMI ?2.  Acute occlusion of OM1 with high-grade 95% stenosis mid left circumflex, and ulcerated 90% stenosis proximal left circumflex ?3.  Mildly reduced left ventricular function, with estimated LVEF 45 to 50%, with inferoapical hypokinesis ?4.  Successful primary PCI with DES OM1, mid left circumflex, and proximal left circumflex ? ?Admitted 11/15/21 due to acute on chronic HF along with AF RVR. Initially given IV lasix with transition to oral diuretics. Needed IV diltiazem with conversion to NSR. Cardiology consult obtained. Given IV feraheme for anemia. Discharged in 2 days. Was in the ED 03/13/21 due to shortness of breath. Ran out of brilinta a few weeks ago and stopped her BP medications. CTA without PE. Compliance enforced and she was released.   ? ?She  presents today for a follow-up visit with a chief complaint of minimal fatigue upon moderate exertion. Says that this has been chronic in nature. She has associated cough, anxiety and weight loss along with this. She denies any difficult sleeping, abdominal distention, palpitations, pedal edema, chest pain, shortness of breath, dizziness or cough.  ? ?Says that she was told that she's diabetic but was not started on any medication for it nor does she check her glucose. Says that she's trying to decrease he sodium intake and isn't snacking as much and hasn't had any fritos or chips since she was discharged.  ? ?Past Medical History:  ?Diagnosis Date  ? Allergic state   ? Anxiety   ? CHF (congestive heart failure) ([place])   ? Coronary artery disease   ? Costochondritis   ? Depression   ? Diabetes mellitus without complication ([place])   ? Esophagitis   ? GERD (gastroesophageal reflux disease)   ? Headache   ? Hemorrhoids   ? Hyperlipidemia   ? Hypertension   ? Panic attacks   ? ?Past Surgical History:  ?Procedure Laterality Date  ? APPENDECTOMY    ? COLONOSCOPY N/A 03/29/2017  ? Procedure: COLONOSCOPY;  Surgeon: Manya Silvas, MD;  Location: [facility] ENDOSCOPY;  Service: Endoscopy;  Laterality: N/A;  ? CORONARY/GRAFT ACUTE MI REVASCULARIZATION N/A 05/20/2020  ? Procedure: Coronary/Graft Acute MI Revascularization;  Surgeon: Isaias Cowman, MD;  Location: [place] CV LAB;  Service: Cardiovascular;  Laterality: N/A;  ? DILATION AND CURETTAGE, DIAGNOSTIC / THERAPEUTIC    ?  ESOPHAGOGASTRODUODENOSCOPY (EGD) WITH PROPOFOL N/A 03/29/2017  ? Procedure: ESOPHAGOGASTRODUODENOSCOPY (EGD) WITH PROPOFOL;  Surgeon: Manya Silvas, MD;  Location: [facility] ENDOSCOPY;  Service: Endoscopy;  Laterality: N/A;  ? KNEE SURGERY    ? LEFT HEART CATH AND CORONARY ANGIOGRAPHY N/A 05/20/2020  ? Procedure: LEFT HEART CATH AND CORONARY ANGIOGRAPHY;  Surgeon: Isaias Cowman, MD;  Location: [place] CV LAB;  Service: Cardiovascular;   Laterality: N/A;  ? ?Family History  ?Problem Relation Age of Onset  ? Heart attack Neg Hx   ? Heart disease Neg Hx   ? ?Social History  ? ?Tobacco Use  ? Smoking status: Never  ? Smokeless tobacco: Never  ?Substance Use Topics  ? Alcohol use: No  ? ?Allergies  ?Allergen Reactions  ? Aspirin   ? Chlorpheniramine Maleate   ? Codeine   ? Statins Other (See Comments)  ?  Muscle Spasms  ? Sulfa Antibiotics   ? Penicillins Rash  ? ?Prior to Admission medications   ?Medication Sig Start Date End Date Taking? Authorizing Provider  ?acetaminophen (TYLENOL) 325 MG tablet Take 650 mg by mouth every 6 (six) hours as needed for mild pain.   Yes [provider]  ?apixaban (ELIQUIS) 2.5 MG TABS tablet Take 1 tablet (2.5 mg total) by mouth 2 (two) times daily. 11/17/21  Yes Lorella Nimrod, MD  ?atorvastatin (LIPITOR) 80 MG tablet Take 1 tablet (80 mg total) by mouth daily. 06/09/21  Yes Alisa Graff, FNP  ?clonazePAM (KLONOPIN) 0.5 MG tablet Take 0.25 mg by mouth daily.   Yes [provider]  ?empagliflozin (JARDIANCE) 10 MG TABS tablet Take 1 tablet (10 mg total) by mouth daily before breakfast. 11/17/21  Yes Lorella Nimrod, MD  ?ferrous sulfate 325 (65 FE) MG tablet Take 1 tablet (325 mg total) by mouth 2 (two) times daily with a meal. 11/17/21  Yes Lorella Nimrod, MD  ?furosemide (LASIX) 20 MG tablet Take 1 tablet (20 mg total) by mouth daily as needed for fluid or edema. 11/17/21 11/17/22 Yes Lorella Nimrod, MD  ?lisinopril (ZESTRIL) 2.5 MG tablet Take 1 tablet (2.5 mg total) by mouth daily. 06/09/21  Yes Alisa Graff, FNP  ?metoprolol tartrate (LOPRESSOR) 25 MG tablet Take 1 tablet (25 mg total) by mouth 2 (two) times daily. 05/10/21  Yes Alisa Graff, FNP  ?omeprazole (PRILOSEC) 20 MG capsule Take 20 mg by mouth daily.   Yes [provider]  ?polyethylene glycol (MIRALAX / GLYCOLAX) 17 g packet Take 17 g by mouth daily as needed for mild constipation. 11/17/21  Yes Lorella Nimrod, MD  ?sertraline  (ZOLOFT) 100 MG tablet Take 100 mg by mouth daily.   Yes [provider]  ?nitroGLYCERIN (NITROSTAT) 0.4 MG SL tablet Place 1 tablet (0.4 mg total) under the tongue every 5 (five) minutes as needed for chest pain. ?Patient not taking: Reported on 12/19/2021 05/22/20   Lorella Nimrod, MD  ? ?Review of Systems  ?Constitutional:  Positive for fatigue. Negative for appetite change.  ?HENT:  Negative for congestion, postnasal drip and sore throat.   ?Eyes: Negative.   ?Respiratory:  Positive for cough (dry cough). Negative for shortness of breath.   ?Cardiovascular:  Negative for chest pain, palpitations and leg swelling.  ?Gastrointestinal:  Positive for diarrhea. Negative for abdominal distention and abdominal pain.  ?Endocrine: Negative.   ?Genitourinary: Negative.   ?Musculoskeletal:  Negative for back pain and neck pain.  ?Skin: Negative.   ?Allergic/Immunologic: Negative.   ?Neurological:  Negative for dizziness and  light-headedness.  ?Hematological:  Negative for adenopathy. Does not bruise/bleed easily.  ?Psychiatric/Behavioral:  Negative for dysphoric mood and sleep disturbance (sleeping on 1 pillow). The patient is nervous/anxious.   ? ?Vitals:  ? 12/19/21 1236  ?BP: 117/65  ?Pulse: 75  ?Resp: 14  ?SpO2: 100%  ?Weight: 117 lb 2 oz (53.1 kg)  ?Height: '5\' 5"'$  (1.651 m)  ? ?Wt Readings from Last 3 Encounters:  ?12/19/21 117 lb 2 oz (53.1 kg)  ?11/17/21 143 lb 15.4 oz (65.3 kg)  ?09/14/21 138 lb 4 oz (62.7 kg)  ? ?Lab Results  ?Component Value Date  ? CREATININE 1.14 (H) 11/17/2021  ? CREATININE 1.03 (H) 11/16/2021  ? CREATININE 1.01 (H) 11/15/2021  ? ? ?Physical Exam ?Vitals and nursing note reviewed. Exam conducted with a chaperone present (friend).  ?Constitutional:   ?   Appearance: Normal appearance.  ?HENT:  ?   Head: Normocephalic and atraumatic.  ?Cardiovascular:  ?   Rate and Rhythm: Normal rate and regular rhythm.  ?Pulmonary:  ?   Effort: Pulmonary effort is normal. No respiratory distress.  ?    Breath sounds: No wheezing or rales.  ?Abdominal:  ?   General: There is no distension.  ?   Palpations: Abdomen is soft.  ?   Tenderness: There is no abdominal tenderness.  ?Musculoskeletal:     ?   General: No te

## 2021-12-19 NOTE — Patient Instructions (Addendum)
Continue weighing daily and call for an overnight weight gain of 3 pounds or more or a weekly weight gain of more than 5 pounds. ? ? ?If you have voicemail, please make sure your mailbox is cleaned out so that we may leave a message and please make sure to listen to any voicemails.  ? ? ?I've put the diabetes referral in and they will call you.  ?

## 2022-01-18 ENCOUNTER — Encounter: Payer: Self-pay | Admitting: Family

## 2022-01-18 ENCOUNTER — Ambulatory Visit: Payer: PPO | Attending: Family | Admitting: Family

## 2022-01-18 VITALS — BP 133/69 | HR 89 | Resp 16 | Ht 65.0 in | Wt 116.4 lb

## 2022-01-18 DIAGNOSIS — E1122 Type 2 diabetes mellitus with diabetic chronic kidney disease: Secondary | ICD-10-CM | POA: Diagnosis not present

## 2022-01-18 DIAGNOSIS — I2510 Atherosclerotic heart disease of native coronary artery without angina pectoris: Secondary | ICD-10-CM | POA: Diagnosis not present

## 2022-01-18 DIAGNOSIS — F419 Anxiety disorder, unspecified: Secondary | ICD-10-CM | POA: Insufficient documentation

## 2022-01-18 DIAGNOSIS — I10 Essential (primary) hypertension: Secondary | ICD-10-CM

## 2022-01-18 DIAGNOSIS — I5022 Chronic systolic (congestive) heart failure: Secondary | ICD-10-CM

## 2022-01-18 DIAGNOSIS — F32A Depression, unspecified: Secondary | ICD-10-CM | POA: Diagnosis not present

## 2022-01-18 DIAGNOSIS — E785 Hyperlipidemia, unspecified: Secondary | ICD-10-CM | POA: Diagnosis not present

## 2022-01-18 DIAGNOSIS — I110 Hypertensive heart disease with heart failure: Secondary | ICD-10-CM | POA: Diagnosis not present

## 2022-01-18 DIAGNOSIS — N1831 Chronic kidney disease, stage 3a: Secondary | ICD-10-CM

## 2022-01-18 DIAGNOSIS — I509 Heart failure, unspecified: Secondary | ICD-10-CM | POA: Insufficient documentation

## 2022-01-18 DIAGNOSIS — E119 Type 2 diabetes mellitus without complications: Secondary | ICD-10-CM | POA: Insufficient documentation

## 2022-01-18 DIAGNOSIS — R197 Diarrhea, unspecified: Secondary | ICD-10-CM | POA: Insufficient documentation

## 2022-01-18 DIAGNOSIS — K2100 Gastro-esophageal reflux disease with esophagitis, without bleeding: Secondary | ICD-10-CM | POA: Insufficient documentation

## 2022-01-18 NOTE — Patient Instructions (Addendum)
Continue weighing daily and call for an overnight weight gain of 3 pounds or more or a weekly weight gain of more than 5 pounds.   If you have voicemail, please make sure your mailbox is cleaned out so that we may leave a message and please make sure to listen to any voicemails.    Hold jardiance for the next week and see if diarrhea improves

## 2022-01-18 NOTE — Progress Notes (Signed)
Patient ID: Emily Jones, female    DOB: 08-25-1941, 81 y.o.   MRN: [id number]  HPI  Emily Jones is a 81 y/o female with a history of CAD, DM, hyperlipidemia, HTN, anxiety, depression, GERD, panic attacks and chronic heart failure.   Echo report from 11/16/21 reviewed and showed an EF of 45-50% along with mild LVH and moderate MR. Echo report from 05/20/20 reviewed and showed an EF of 50-55% along with mild LAE and moderate MR.   LHC done 05/20/20 showed: 1st Mrg lesion is 100% stenosed. Mid Cx lesion is 95% stenosed. Ost Cx to Prox Cx lesion is 90% stenosed. Mid LAD lesion is 50% stenosed. Mid LAD to Dist LAD lesion is 40% stenosed. Prox RCA lesion is 30% stenosed. Dist RCA lesion is 40% stenosed. RPAV lesion is 50% stenosed. A drug-eluting stent was successfully placed using a STENT RESOLUTE ONYX 2.0X15. Post intervention, there is a 0% residual stenosis. A drug-eluting stent was successfully placed using a [place] 2.53G64. Post intervention, there is a 0% residual stenosis. A drug-eluting stent was successfully placed using a [place] [id number]. Post intervention, there is a 0% residual stenosis.   1.  Inferior STEMI 2.  Acute occlusion of OM1 with high-grade 95% stenosis mid left circumflex, and ulcerated 90% stenosis proximal left circumflex 3.  Mildly reduced left ventricular function, with estimated LVEF 45 to 50%, with inferoapical hypokinesis 4.  Successful primary PCI with DES OM1, mid left circumflex, and proximal left circumflex  Admitted 11/15/21 due to acute on chronic HF along with AF RVR. Initially given IV lasix with transition to oral diuretics. Needed IV diltiazem with conversion to NSR. Cardiology consult obtained. Given IV feraheme for anemia. Discharged in 2 days.   She presents today for a follow-up visit with a chief complaint of minimal fatigue upon moderate exertion. Describes this as chronic in nature. She has associated dry cough, anxiety  and diarrhea along with this. She denies any difficulty sleeping, dizziness, abdominal distention, palpitations, pedal edema, chest pain, shortness of breath or weight gain.   She feels like her diarrhea may have started when she started her jardiance and is wondering if it can be held to see if the diarrhea improved.   Past Medical History:  Diagnosis Date   Allergic state    Anxiety    CHF (congestive heart failure) ([place])    Coronary artery disease    Costochondritis    Depression    Diabetes mellitus without complication ([place])    Esophagitis    GERD (gastroesophageal reflux disease)    Headache    Hemorrhoids    Hyperlipidemia    Hypertension    Panic attacks    Past Surgical History:  Procedure Laterality Date   APPENDECTOMY     COLONOSCOPY N/A 03/29/2017   Procedure: COLONOSCOPY;  Surgeon: Manya Silvas, MD;  Location: [facility] ENDOSCOPY;  Service: Endoscopy;  Laterality: N/A;   CORONARY/GRAFT ACUTE MI REVASCULARIZATION N/A 05/20/2020   Procedure: Coronary/Graft Acute MI Revascularization;  Surgeon: Isaias Cowman, MD;  Location: [place] CV LAB;  Service: Cardiovascular;  Laterality: N/A;   DILATION AND CURETTAGE, DIAGNOSTIC / THERAPEUTIC     ESOPHAGOGASTRODUODENOSCOPY (EGD) WITH PROPOFOL N/A 03/29/2017   Procedure: ESOPHAGOGASTRODUODENOSCOPY (EGD) WITH PROPOFOL;  Surgeon: Manya Silvas, MD;  Location: [facility] ENDOSCOPY;  Service: Endoscopy;  Laterality: N/A;   KNEE SURGERY     LEFT HEART CATH AND CORONARY ANGIOGRAPHY N/A 05/20/2020   Procedure: LEFT HEART CATH AND CORONARY  ANGIOGRAPHY;  Surgeon: Isaias Cowman, MD;  Location: [place] CV LAB;  Service: Cardiovascular;  Laterality: N/A;   Family History  Problem Relation Age of Onset   Heart attack Neg Hx    Heart disease Neg Hx    Social History   Tobacco Use   Smoking status: Never   Smokeless tobacco: Never  Substance Use Topics   Alcohol use: No   Allergies  Allergen Reactions   Aspirin     Chlorpheniramine Maleate    Codeine    Statins Other (See Comments)    Muscle Spasms   Sulfa Antibiotics    Penicillins Rash   Prior to Admission medications   Medication Sig Start Date End Date Taking? Authorizing Provider  acetaminophen (TYLENOL) 325 MG tablet Take 650 mg by mouth every 6 (six) hours as needed for mild pain.   Yes [provider]  apixaban (ELIQUIS) 2.5 MG TABS tablet Take 1 tablet (2.5 mg total) by mouth 2 (two) times daily. 11/17/21  Yes Lorella Nimrod, MD  atorvastatin (LIPITOR) 80 MG tablet Take 1 tablet (80 mg total) by mouth daily. 06/09/21  Yes Taeko Schaffer A, FNP  clonazePAM (KLONOPIN) 0.5 MG tablet Take 0.25 mg by mouth daily.   Yes [provider]  empagliflozin (JARDIANCE) 10 MG TABS tablet Take 1 tablet (10 mg total) by mouth daily before breakfast. 11/17/21  Yes Lorella Nimrod, MD  ferrous sulfate 325 (65 FE) MG tablet Take 1 tablet (325 mg total) by mouth 2 (two) times daily with a meal. 11/17/21  Yes Lorella Nimrod, MD  furosemide (LASIX) 20 MG tablet Take 1 tablet (20 mg total) by mouth daily as needed for fluid or edema. 11/17/21 11/17/22 Yes Lorella Nimrod, MD  lisinopril (ZESTRIL) 2.5 MG tablet Take 1 tablet (2.5 mg total) by mouth daily. 06/09/21  Yes Darylene Price A, FNP  metoprolol tartrate (LOPRESSOR) 25 MG tablet Take 1 tablet (25 mg total) by mouth 2 (two) times daily. 05/10/21  Yes Ugochi Henzler, Otila Kluver A, FNP  omeprazole (PRILOSEC) 20 MG capsule Take 20 mg by mouth daily.   Yes [provider]  polyethylene glycol (MIRALAX / GLYCOLAX) 17 g packet Take 17 g by mouth daily as needed for mild constipation. 11/17/21  Yes Lorella Nimrod, MD  sertraline (ZOLOFT) 100 MG tablet Take 100 mg by mouth daily.   Yes [provider]  nitroGLYCERIN (NITROSTAT) 0.4 MG SL tablet Place 1 tablet (0.4 mg total) under the tongue every 5 (five) minutes as needed for chest pain. Patient not taking: Reported on 12/19/2021 05/22/20   Lorella Nimrod, MD     Review of Systems  Constitutional:  Positive for fatigue. Negative for appetite change.  HENT:  Negative for congestion, postnasal drip and sore throat.   Eyes: Negative.   Respiratory:  Positive for cough (dry cough). Negative for shortness of breath.   Cardiovascular:  Negative for chest pain, palpitations and leg swelling.  Gastrointestinal:  Positive for diarrhea. Negative for abdominal distention and abdominal pain.  Endocrine: Negative.   Genitourinary: Negative.   Musculoskeletal:  Negative for back pain and neck pain.  Skin: Negative.   Allergic/Immunologic: Negative.   Neurological:  Negative for dizziness and light-headedness.  Hematological:  Negative for adenopathy. Does not bruise/bleed easily.  Psychiatric/Behavioral:  Negative for dysphoric mood and sleep disturbance (sleeping on 1 pillow). The patient is nervous/anxious.    Vitals:   01/18/22 1451  BP: 133/69  Pulse: 89  Resp: 16  SpO2: 100%  Weight: 116  lb 6 oz (52.8 kg)  Height: '5\' 5"'$  (1.651 m)   Wt Readings from Last 3 Encounters:  01/18/22 116 lb 6 oz (52.8 kg)  12/19/21 117 lb 2 oz (53.1 kg)  11/17/21 143 lb 15.4 oz (65.3 kg)   Lab Results  Component Value Date   CREATININE 1.14 (H) 11/17/2021   CREATININE 1.03 (H) 11/16/2021   CREATININE 1.01 (H) 11/15/2021   Physical Exam Vitals and nursing note reviewed. Exam conducted with a chaperone present (friend).  Constitutional:      Appearance: Normal appearance.  HENT:     Head: Normocephalic and atraumatic.  Cardiovascular:     Rate and Rhythm: Normal rate and regular rhythm.  Pulmonary:     Effort: Pulmonary effort is normal. No respiratory distress.     Breath sounds: No wheezing or rales.  Abdominal:     General: There is no distension.     Palpations: Abdomen is soft.     Tenderness: There is no abdominal tenderness.  Musculoskeletal:        General: No tenderness.     Cervical back: Normal range of motion and neck supple.     Right  lower leg: No edema.     Left lower leg: No edema.  Skin:    General: Skin is warm and dry.  Neurological:     General: No focal deficit present.     Mental Status: She is alert and oriented to person, place, and time.  Psychiatric:        Mood and Affect: Mood is anxious.        Behavior: Behavior normal.        Thought Content: Thought content normal.    Assessment & Plan:  1: Chronic heart failure with mildly reduced ejection fraction - - NYHA class II - euvolemic today - weighing daily; reminded to call for an overnight weight gain of > 2 pounds or a weekly weight gain of > 5 pounds - weight stable from last visit here 1 month ago - not adding salt and has been looking at food labels for sodium content - on GDMT of jardiance, lisinopril and metoprolol (although tartrate) - will hold jardiance for a couple of weeks and see if diarrhea improves - BNP 11/15/21 was 1999.1  2: HTN- - BP looks good (133/69) - saw PCP Kary Kos) 03/16/21 - BMP 11/17/21 reviewed and showed sodium 138, potassium 3.3, creatinine 1.14 and GFR 49  3: DM- - A1c 11/16/21 was 10.5% - does not currently check her glucose at home - lifestyle center could not reach patient  4: CAD- - stent placed 09/21 - continues on brilinta - saw cardiology ([place]) 11/23/21  5: Anxiety- - saw psychiatry Thurmond Butts) 02/17/21  6: Diarrhea- - patient says that she has 5-6 bouts of diarrhea (or more) daily - she feels like it started when she started taking jardiance - advised her to hold the jardiance for the next week or so and see if diarrhea improves - if it doesn't, she needs to resume taking it and f/u with PCP regarding diarrhea   Reviewed medication bottles  Return in 3 months sooner if needed.

## 2022-01-19 ENCOUNTER — Encounter: Payer: Self-pay | Admitting: Family

## 2022-01-29 ENCOUNTER — Ambulatory Visit: Payer: Self-pay | Admitting: Internal Medicine

## 2022-04-06 ENCOUNTER — Ambulatory Visit: Payer: Self-pay | Admitting: Internal Medicine

## 2022-04-09 ENCOUNTER — Ambulatory Visit (INDEPENDENT_AMBULATORY_CARE_PROVIDER_SITE_OTHER): Payer: PPO | Admitting: Internal Medicine

## 2022-04-09 ENCOUNTER — Encounter: Payer: Self-pay | Admitting: Internal Medicine

## 2022-04-09 VITALS — BP 130/72 | HR 110 | Temp 98.0°F | Resp 18 | Ht 63.75 in | Wt 120.2 lb

## 2022-04-09 DIAGNOSIS — D649 Anemia, unspecified: Secondary | ICD-10-CM

## 2022-04-09 DIAGNOSIS — I5022 Chronic systolic (congestive) heart failure: Secondary | ICD-10-CM

## 2022-04-09 DIAGNOSIS — E876 Hypokalemia: Secondary | ICD-10-CM

## 2022-04-09 DIAGNOSIS — K219 Gastro-esophageal reflux disease without esophagitis: Secondary | ICD-10-CM

## 2022-04-09 DIAGNOSIS — Z1211 Encounter for screening for malignant neoplasm of colon: Secondary | ICD-10-CM

## 2022-04-09 DIAGNOSIS — I2510 Atherosclerotic heart disease of native coronary artery without angina pectoris: Secondary | ICD-10-CM

## 2022-04-09 DIAGNOSIS — I4892 Unspecified atrial flutter: Secondary | ICD-10-CM

## 2022-04-09 DIAGNOSIS — E1165 Type 2 diabetes mellitus with hyperglycemia: Secondary | ICD-10-CM

## 2022-04-09 MED ORDER — METOPROLOL TARTRATE 25 MG PO TABS: 25.0000 mg | ORAL_TABLET | Freq: Two times a day (BID) | ORAL | 1 refills | Status: DC

## 2022-04-09 MED ORDER — OMEPRAZOLE 20 MG PO CPDR: 20.0000 mg | DELAYED_RELEASE_CAPSULE | Freq: Every day | ORAL | 1 refills | Status: DC

## 2022-04-09 MED ORDER — EMPAGLIFLOZIN 25 MG PO TABS: 25.0000 mg | ORAL_TABLET | Freq: Every day | ORAL | 1 refills | Status: DC

## 2022-04-09 MED ORDER — FERROUS SULFATE 325 (65 FE) MG PO TABS: 325.0000 mg | ORAL_TABLET | Freq: Every day | ORAL | 1 refills | Status: DC

## 2022-04-09 NOTE — Patient Instructions (Addendum)
It was great seeing you today!  Plan discussed at today's visit: -Blood work ordered today, results will be uploaded to [place] increased to 25 mg daily, be sure to take all of your medications as directed -Need to decrease sugar and carbs in the diet, more information below. Referral to Nutrition placed today.  -Referral for colon cancer screening placed today as well   Follow up in: 2 weeks   Take care and let us know if you have any questions or concerns prior to your next visit.  Dr. Rosana Berger  Diabetes Mellitus and Nutrition, Adult When you have diabetes, or diabetes mellitus, it is very important to have healthy eating habits because your blood sugar (glucose) levels are greatly affected by what you eat and drink. Eating healthy foods in the right amounts, at about the same times every day, can help you: Manage your blood glucose. Lower your risk of heart disease. Improve your blood pressure. Reach or maintain a healthy weight. What can affect my meal plan? Every person with diabetes is different, and each person has different needs for a meal plan. Your health care provider may recommend that you work with a dietitian to make a meal plan that is best for you. Your meal plan may vary depending on factors such as: The calories you need. The medicines you take. Your weight. Your blood glucose, blood pressure, and cholesterol levels. Your activity level. Other health conditions you have, such as heart or kidney disease. How do carbohydrates affect me? Carbohydrates, also called carbs, affect your blood glucose level more than any other type of food. Eating carbs raises the amount of glucose in your blood. It is important to know how many carbs you can safely have in each meal. This is different for every person. Your dietitian can help you calculate how many carbs you should have at each meal and for each snack. How does alcohol affect me? Alcohol can cause a decrease in  blood glucose (hypoglycemia), especially if you use insulin or take certain diabetes medicines by mouth. Hypoglycemia can be a life-threatening condition. Symptoms of hypoglycemia, such as sleepiness, dizziness, and confusion, are similar to symptoms of having too much alcohol. Do not drink alcohol if: Your health care provider tells you not to drink. You are pregnant, may be pregnant, or are planning to become pregnant. If you drink alcohol: Limit how much you have to: 0-1 drink a day for women. 0-2 drinks a day for men. Know how much alcohol is in your drink. In the [place] one drink equals one 12 oz bottle of beer (355 mL), one 5 oz glass of wine (148 mL), or one 1 oz glass of hard liquor (44 mL). Keep yourself hydrated with water, diet soda, or unsweetened iced tea. Keep in mind that regular soda, juice, and other mixers may contain a lot of sugar and must be counted as carbs. What are tips for following this plan?  Reading food labels Start by checking the serving size on the Nutrition Facts label of packaged foods and drinks. The number of calories and the amount of carbs, fats, and other nutrients listed on the label are based on one serving of the item. Many items contain more than one serving per package. Check the total grams (g) of carbs in one serving. Check the number of grams of saturated fats and trans fats in one serving. Choose foods that have a low amount or none of these fats. Check the number of  milligrams (mg) of salt (sodium) in one serving. Most people should limit total sodium intake to less than 2,300 mg per day. Always check the nutrition information of foods labeled as "low-fat" or "nonfat." These foods may be higher in added sugar or refined carbs and should be avoided. Talk to your dietitian to identify your daily goals for nutrients listed on the label. Shopping Avoid buying canned, pre-made, or processed foods. These foods tend to be high in fat, sodium, and added  sugar. Shop around the outside edge of the grocery store. This is where you will most often find fresh fruits and vegetables, bulk grains, fresh meats, and fresh dairy products. Cooking Use low-heat cooking methods, such as baking, instead of high-heat cooking methods, such as deep frying. Cook using healthy oils, such as olive, canola, or sunflower oil. Avoid cooking with butter, cream, or high-fat meats. Meal planning Eat meals and snacks regularly, preferably at the same times every day. Avoid going long periods of time without eating. Eat foods that are high in fiber, such as fresh fruits, vegetables, beans, and whole grains. Eat 4-6 oz (112-168 g) of lean protein each day, such as lean meat, chicken, fish, eggs, or tofu. One ounce (oz) (28 g) of lean protein is equal to: 1 oz (28 g) of meat, chicken, or fish. 1 egg.  cup (62 g) of tofu. Eat some foods each day that contain healthy fats, such as avocado, nuts, seeds, and fish. What foods should I eat? Fruits Berries. Apples. Oranges. Peaches. Apricots. Plums. Grapes. Mangoes. Papayas. Pomegranates. Kiwi. Cherries. Vegetables Leafy greens, including lettuce, spinach, kale, chard, collard greens, mustard greens, and cabbage. Beets. Cauliflower. Broccoli. Carrots. Green beans. Tomatoes. Peppers. Onions. Cucumbers. Brussels sprouts. Grains Whole grains, such as whole-wheat or whole-grain bread, crackers, tortillas, cereal, and pasta. Unsweetened oatmeal. Quinoa. Brown or wild rice. Meats and other proteins Seafood. Poultry without skin. Lean cuts of poultry and beef. Tofu. Nuts. Seeds. Dairy Low-fat or fat-free dairy products such as milk, yogurt, and cheese. The items listed above may not be a complete list of foods and beverages you can eat and drink. Contact a dietitian for more information. What foods should I avoid? Fruits Fruits canned with syrup. Vegetables Canned vegetables. Frozen vegetables with butter or cream  sauce. Grains Refined white flour and flour products such as bread, pasta, snack foods, and cereals. Avoid all processed foods. Meats and other proteins Fatty cuts of meat. Poultry with skin. Breaded or fried meats. Processed meat. Avoid saturated fats. Dairy Full-fat yogurt, cheese, or milk. Beverages Sweetened drinks, such as soda or iced tea. The items listed above may not be a complete list of foods and beverages you should avoid. Contact a dietitian for more information. Questions to ask a health care provider Do I need to meet with a certified diabetes care and education specialist? Do I need to meet with a dietitian? What number can I call if I have questions? When are the best times to check my blood glucose? Where to find more information: American Diabetes Association: diabetes.org Academy of Nutrition and Dietetics: eatright.[employer] of Diabetes and Digestive and Kidney Diseases: [employer]is Association of Diabetes Care & Education Specialists: diabeteseducator.org Summary It is important to have healthy eating habits because your blood sugar (glucose) levels are greatly affected by what you eat and drink. It is important to use alcohol carefully. A healthy meal plan will help you manage your blood glucose and lower your risk of heart disease. Your health care  provider may recommend that you work with a dietitian to make a meal plan that is best for you. This information is not intended to replace advice given to you by your health care provider. Make sure you discuss any questions you have with your health care provider. Document Revised: 03/16/2020 Document Reviewed: 03/16/2020 Elsevier Patient Education  [place].

## 2022-04-09 NOTE — Progress Notes (Signed)
New Patient Office Visit  Subjective    Patient ID: Emily Jones, female    DOB: 1941-07-02  Age: 81 y.o. MRN: [id number]  CC:  Chief Complaint  Patient presents with   Establish Care   Medication Refill    HPI Emily Jones presents to establish care. She does follow with the heart failure clinic but has not had a PCP in sometime.   Hypertension/CHF/Atrial Flutter -Medications: Eliquis 2.5 mg BID, Lasix 20 mg, Lisinopril 2.5 mg, Metoprolol 25 mg BID, Jardiance 10 mg. Nitro PRN. -Patient is compliant with above medications and reports no side effects. -Had been hospitalized in March for CHF exacerbation/leg swelling. Has been taking Lasix 20 mg daily and denies leg edema today. -Denies any SOB, CP, vision changes, LE edema or symptoms of hypotension -Most recent echo from 11/16/21 showing EF 45-50% with mild LVH and moderate mitral regurgitation.  -Did have cath 05/20/20 with PCI placement   -Does follow with Cardiology, note from 01/18/22 reviewed.   HLD: -Medications: Lipitor 80 mg -Patient is compliant with above medications and reports no side effects.  -Last lipid panel: uncertain   Diabetes, Type 2: -Last A1c 10.5% 3/23 -Medications: Jardiance 10 mg  -Patient is compliant with the above medications and reports no side effects.  -Checking BG at home: No -Diet: No restrictions  -Eye exam: Due, discuss at follow up  -Foot exam: Due, discuss at follow up  -Microalbumin: Due, discuss at follow up -Statin: yes -PNA vaccine: Due -Denies symptoms of hypoglycemia, polyuria, polydipsia, numbness extremities, foot ulcers/trauma.   GERD: -Currently on Prilosec 20 mg daily   Anemia: -Unknown etiology  -Hgb from the hospital 11/17/21 7.3 -Denies abnormal bleeding, no blood in stools or urine -Now on Eliquis for new on-set A.fib started inpatient    Outpatient Encounter Medications as of 04/09/2022  Medication Sig   apixaban (ELIQUIS) 2.5 MG TABS tablet Take 1 tablet (2.5  mg total) by mouth 2 (two) times daily.   atorvastatin (LIPITOR) 80 MG tablet Take 1 tablet (80 mg total) by mouth daily.   furosemide (LASIX) 20 MG tablet Take 1 tablet (20 mg total) by mouth daily as needed for fluid or edema.   lisinopril (ZESTRIL) 2.5 MG tablet Take 1 tablet (2.5 mg total) by mouth daily.   nitroGLYCERIN (NITROSTAT) 0.4 MG SL tablet Place 1 tablet (0.4 mg total) under the tongue every 5 (five) minutes as needed for chest pain.   acetaminophen (TYLENOL) 325 MG tablet Take 650 mg by mouth every 6 (six) hours as needed for mild pain.   empagliflozin (JARDIANCE) 10 MG TABS tablet Take 1 tablet (10 mg total) by mouth daily before breakfast. (Patient not taking: Reported on 04/09/2022)   ferrous sulfate 325 (65 FE) MG tablet Take 1 tablet (325 mg total) by mouth 2 (two) times daily with a meal. (Patient not taking: Reported on 04/09/2022)   metoprolol tartrate (LOPRESSOR) 25 MG tablet Take 1 tablet (25 mg total) by mouth 2 (two) times daily. (Patient not taking: Reported on 04/09/2022)   omeprazole (PRILOSEC) 20 MG capsule Take 20 mg by mouth daily. (Patient not taking: Reported on 04/09/2022)   [DISCONTINUED] clonazePAM (KLONOPIN) 0.5 MG tablet Take 0.25 mg by mouth daily.   [DISCONTINUED] polyethylene glycol (MIRALAX / GLYCOLAX) 17 g packet Take 17 g by mouth daily as needed for mild constipation.   [DISCONTINUED] sertraline (ZOLOFT) 100 MG tablet Take 100 mg by mouth daily.   No facility-administered encounter medications on file as  of 04/09/2022.    Past Medical History:  Diagnosis Date   Allergic state    Anxiety    CHF (congestive heart failure) ([place])    Coronary artery disease    Costochondritis    Depression    Diabetes mellitus without complication ([place])    Esophagitis    GERD (gastroesophageal reflux disease)    Headache    Hemorrhoids    Hyperlipidemia    Hypertension    Panic attacks     Past Surgical History:  Procedure Laterality Date   APPENDECTOMY      COLONOSCOPY N/A 03/29/2017   Procedure: COLONOSCOPY;  Surgeon: Manya Silvas, MD;  Location: [facility] ENDOSCOPY;  Service: Endoscopy;  Laterality: N/A;   CORONARY/GRAFT ACUTE MI REVASCULARIZATION N/A 05/20/2020   Procedure: Coronary/Graft Acute MI Revascularization;  Surgeon: Isaias Cowman, MD;  Location: [place] CV LAB;  Service: Cardiovascular;  Laterality: N/A;   DILATION AND CURETTAGE, DIAGNOSTIC / THERAPEUTIC     ESOPHAGOGASTRODUODENOSCOPY (EGD) WITH PROPOFOL N/A 03/29/2017   Procedure: ESOPHAGOGASTRODUODENOSCOPY (EGD) WITH PROPOFOL;  Surgeon: Manya Silvas, MD;  Location: [facility] ENDOSCOPY;  Service: Endoscopy;  Laterality: N/A;   KNEE SURGERY     LEFT HEART CATH AND CORONARY ANGIOGRAPHY N/A 05/20/2020   Procedure: LEFT HEART CATH AND CORONARY ANGIOGRAPHY;  Surgeon: Isaias Cowman, MD;  Location: [place] CV LAB;  Service: Cardiovascular;  Laterality: N/A;    Family History  Problem Relation Age of Onset   Hyperlipidemia Mother    Hypertension Mother    Heart failure Mother    Colon cancer Father    Kidney cancer Brother    Hypertension Maternal Grandmother    Heart failure Maternal Grandmother    Heart attack Neg Hx    Heart disease Neg Hx     Social History   Socioeconomic History   Marital status: Married    Spouse name: Not on file   Number of children: Not on file   Years of education: Not on file   Highest education level: Not on file  Occupational History   Not on file  Tobacco Use   Smoking status: Never   Smokeless tobacco: Never  Vaping Use   Vaping Use: Never used  Substance and Sexual Activity   Alcohol use: No   Drug use: No   Sexual activity: Not Currently  Other Topics Concern   Not on file  Social History Narrative   Not on file   Social Determinants of Health   Financial Resource Strain: Not on file  Food Insecurity: Not on file  Transportation Needs: Not on file  Physical Activity: Not on file  Stress: Not on file   Social Connections: Not on file  Intimate Partner Violence: Not on file    Review of Systems  Constitutional:  Negative for chills and fever.  Respiratory:  Negative for shortness of breath.   Cardiovascular:  Negative for chest pain and palpitations.  Gastrointestinal:  Negative for blood in stool and melena.  Genitourinary:  Negative for hematuria.     Objective    BP 130/72   Pulse (!) 110   Temp 98 F (36.7 C)   Resp 18   Ht 5' 3.75" (1.619 m)   Wt 120 lb 3.2 oz (54.5 kg)   SpO2 96%   BMI 20.79 kg/m   Vitals:   04/09/22 1113 04/09/22 1121  BP: (!) 142/80 130/72     Physical Exam Constitutional:      Appearance: Normal appearance.  HENT:  Head: Normocephalic and atraumatic.  Eyes:     Conjunctiva/sclera: Conjunctivae normal.  Cardiovascular:     Rate and Rhythm: Normal rate. Rhythm irregular.  Pulmonary:     Effort: Pulmonary effort is normal.     Breath sounds: Normal breath sounds.  Musculoskeletal:     Right lower leg: No edema.     Left lower leg: No edema.  Skin:    General: Skin is warm and dry.  Neurological:     General: No focal deficit present.     Mental Status: She is alert. Mental status is at baseline.  Psychiatric:        Mood and Affect: Mood normal.        Behavior: Behavior normal.         Assessment & Plan:   1. Chronic systolic congestive heart failure (HCC)/Atrial flutter, unspecified type [facility]):  Continue Lasix 20 mg, Metoprolol 25 mg BID, Lisinopril 2.5 mg, Eliquis 2.5 mg BID. Patient has been out of some of these medications for some time and/or has not been taking as directed. Appropriate refilled placed today as well as a referral to case management to help with medication compliance and chronic conditions.   - metoprolol tartrate (LOPRESSOR) 25 MG tablet; Take 1 tablet (25 mg total) by mouth 2 (two) times daily.  Dispense: 180 tablet; Refill: 1 - empagliflozin (JARDIANCE) 25 MG TABS tablet; Take 1 tablet (25 mg total)  by mouth daily before breakfast.  Dispense: 90 tablet; Refill: 1 - AMB Referral to [place]  2. Coronary artery disease involving native coronary artery of native heart without angina pectoris: On Eliquis and has a stent placed. Continue Lipitor 80 mg and recheck lipid panel today.   - Lipid Profile - AMB Referral to [facility]  3. Type 2 diabetes mellitus with hyperglycemia, without long-term current use of insulin ([place]): A1c in the hospital 10.3. She is only on Jardiance, which we will increase to 25 mg. Check A1c today. Discussed diabetes etiology and diet changes. Referral to nutrition placed. Follow up in 2 weeks to discuss other medication options.   - Basic Metabolic Panel (BMET) - HgB A1c - empagliflozin (JARDIANCE) 25 MG TABS tablet; Take 1 tablet (25 mg total) by mouth daily before breakfast.  Dispense: 90 tablet; Refill: 1 - Referral to Nutrition and Diabetes Services - AMB Referral to [facility]ordin[facility]  4. GERD without esophagitis: Continue Prilosec 20 mg, refilled today.   - omeprazole (PRILOSEC) 20 MG capsule; Take 1 capsule (20 mg total) by mouth daily.  Dispense: 90 capsule; Refill: 1 - AMB Referral to [place]  5. Anemia, unspecified type: Uncertain etiology, CBC and iron panel today. Was started on ferrous sulfate in the hospital, patient ran out of this medication and needs it refilled.   - CBC w/Diff/Platelet - ferrous sulfate 325 (65 FE) MG tablet; Take 1 tablet (325 mg total) by mouth daily with breakfast.  Dispense: 90 tablet; Refill: 1 - Fe+TIBC+Fer  6. Screening for colon cancer: Referral placed.   - Ambulatory referral to Gastroenterology    Return in about 2 weeks (around 04/23/2022).   Teodora Medici, DO

## 2022-04-10 ENCOUNTER — Telehealth: Payer: Self-pay | Admitting: *Deleted

## 2022-04-10 ENCOUNTER — Other Ambulatory Visit: Payer: Self-pay | Admitting: Internal Medicine

## 2022-04-10 DIAGNOSIS — E876 Hypokalemia: Secondary | ICD-10-CM

## 2022-04-10 LAB — LIPID PANEL
Cholesterol: 154 mg/dL (ref <200)
HDL: 53 mg/dL (ref >=50)
LDL Cholesterol (Calc): 80 mg/dL (calc)
Non-HDL Cholesterol (Calc): 101 mg/dL (calc) (ref <130)
Total CHOL/HDL Ratio: 2.9 (calc) (ref <5.0)
Triglycerides: 118 mg/dL (ref <150)

## 2022-04-10 LAB — CBC WITH DIFFERENTIAL/PLATELET
Absolute Monocytes: 339 cells/uL (ref 200–950)
Basophils Absolute: 31 cells/uL (ref 0–200)
Basophils Relative: 0.7 %
Eosinophils Absolute: 251 cells/uL (ref 15–500)
Eosinophils Relative: 5.7 %
HCT: 37.4 % (ref 35.0–45.0)
Hemoglobin: 12.4 g/dL (ref 11.7–15.5)
Lymphs Abs: 994 cells/uL (ref 850–3900)
MCH: 30.4 pg (ref 27.0–33.0)
MCHC: 33.2 g/dL (ref 32.0–36.0)
MCV: 91.7 fL (ref 80.0–100.0)
MPV: 11.2 fL (ref 7.5–12.5)
Monocytes Relative: 7.7 %
Neutro Abs: 2785 cells/uL (ref 1500–7800)
Neutrophils Relative %: 63.3 %
Platelets: 162 10*3/uL (ref 140–400)
RBC: 4.08 10*6/uL (ref 3.80–5.10)
RDW: 13.2 % (ref 11.0–15.0)
Total Lymphocyte: 22.6 %
WBC: 4.4 10*3/uL (ref 3.8–10.8)

## 2022-04-10 LAB — BASIC METABOLIC PANEL
BUN/Creatinine Ratio: 15 (calc) (ref 6–22)
BUN: 15 mg/dL (ref 7–25)
CO2: 31 mmol/L (ref 20–32)
Calcium: 9 mg/dL (ref 8.6–10.4)
Chloride: 98 mmol/L (ref 98–110)
Creat: 0.99 mg/dL — ABNORMAL HIGH (ref 0.60–0.95)
Glucose, Bld: 261 mg/dL — ABNORMAL HIGH (ref 65–99)
Potassium: 2.9 mmol/L — ABNORMAL LOW (ref 3.5–5.3)
Sodium: 139 mmol/L (ref 135–146)

## 2022-04-10 LAB — IRON,TIBC AND FERRITIN PANEL
%SAT: 23 % (calc) (ref 16–45)
Ferritin: 33 ng/mL (ref 16–288)
Iron: 73 ug/dL (ref 45–160)
TIBC: 318 mcg/dL (calc) (ref 250–450)

## 2022-04-10 LAB — HEMOGLOBIN A1C
Hgb A1c MFr Bld: 8.2 % of total Hgb — ABNORMAL HIGH (ref <5.7)
Mean Plasma Glucose: 189 mg/dL
eAG (mmol/L): 10.4 mmol/L

## 2022-04-10 MED ORDER — POTASSIUM CHLORIDE CRYS ER 10 MEQ PO TBCR: 10.0000 meq | EXTENDED_RELEASE_TABLET | Freq: Every day | ORAL | 0 refills | Status: DC

## 2022-04-10 NOTE — Telephone Encounter (Signed)
reordered today 04/10/22  Requested Prescriptions  Refused Prescriptions Disp Refills  . potassium chloride (KLOR-CON M) 10 MEQ tablet [Pharmacy Med Name: POTASSIUM CL MICRO 10MEQ ER TABS] 90 tablet     Sig: TAKE 1 TABLET(10 MEQ) BY MOUTH DAILY     Endocrinology:  Minerals - Potassium Supplementation Failed - 04/10/2022  8:55 AM      Failed - K in normal range and within 360 days    Potassium  Date Value Ref Range Status  04/09/2022 2.9 (L) 3.5 - 5.3 mmol/L Final         Failed - Cr in normal range and within 360 days    Creat  Date Value Ref Range Status  04/09/2022 0.99 (H) 0.60 - 0.95 mg/dL Final         Passed - Valid encounter within last 12 months    Recent Outpatient Visits          Yesterday Type 2 diabetes mellitus with hyperglycemia, without long-term current use of insulin [facility])   [facility] Teodora Medici, DO      Future Appointments            In 2 weeks Teodora Medici, [facility], [facility] [address]

## 2022-04-10 NOTE — Addendum Note (Signed)
Addended by: Teodora Medici on: 04/10/2022 07:50 AM   Modules accepted: Orders

## 2022-04-10 NOTE — Chronic Care Management (AMB) (Signed)
  Chronic Care Management   Outreach Note  04/10/2022 Name: Emily Jones MRN: [id number] DOB: 1941-08-15  Emily Jones is a 81 y.o. year old female who is a primary care patient of Pcp, No. I reached out to Dorris Fetch by phone today in response to a referral sent by [name] [name]'s primary care provider.  An unsuccessful telephone outreach was attempted today. The patient was referred to the case management team for assistance with care management and care coordination.   Follow Up Plan: A HIPAA compliant phone message was left for the patient providing contact information and requesting a return call.   Julian Hy, [place] Direct Dial: [phone]

## 2022-04-16 NOTE — Chronic Care Management (AMB) (Signed)
  Chronic Care Management   Outreach Note  04/16/2022 Name: Emily Jones MRN: [id number] DOB: 1941/04/21  Emily Jones is a 81 y.o. year old female who is a primary care patient of Pcp, No. I reached out to Dorris Fetch by phone today in response to a referral sent by [name]'s primary care provider.  A second unsuccessful telephone outreach was attempted today. The patient was referred to the case management team for assistance with care management and care coordination.   Follow Up Plan: A HIPAA compliant phone message was left for the patient providing contact information and requesting a return call.   Julian Hy, [place] Direct Dial: [phone]

## 2022-04-17 ENCOUNTER — Ambulatory Visit: Payer: PPO | Admitting: Family

## 2022-04-18 ENCOUNTER — Telehealth: Payer: Self-pay | Admitting: Family

## 2022-04-18 ENCOUNTER — Ambulatory Visit: Payer: PPO | Admitting: Family

## 2022-04-18 NOTE — Telephone Encounter (Signed)
Patient did not show for her Heart Failure Clinic appointment on 04/18/22. Will attempt to reschedule.

## 2022-04-19 ENCOUNTER — Ambulatory Visit: Payer: PPO | Admitting: Family

## 2022-04-24 NOTE — Chronic Care Management (AMB) (Signed)
  Chronic Care Management   Outreach Note  04/24/2022 Name: Emily Jones MRN: [id number] DOB: May 16, 1941  Emily Jones is a 81 y.o. year old female who is a primary care patient of Pcp, No. I reached out to Dorris Fetch by phone today in response to a referral sent by [name]'s primary care provider.  Third unsuccessful telephone outreach was attempted today. The patient was referred to the case management team for assistance with care management and care coordination. The patient's primary care provider has been notified of our unsuccessful attempts to make or maintain contact with the patient. The care management team is pleased to engage with this patient at any time in the future should he/she be interested in assistance from the care management team.   Follow Up Plan: We have been unable to make contact with the patient for follow up. The care management team is available to follow up with the patient after provider conversation with the patient regarding recommendation for care management engagement and subsequent re-referral to the care management team.   Julian Hy, [place] Direct Dial: [phone]

## 2022-04-26 ENCOUNTER — Telehealth: Payer: Self-pay | Admitting: Family

## 2022-04-26 NOTE — Telephone Encounter (Signed)
Have been unable to reach patient to get her appointment rescheduled after she missed last 3 scheduled days. We will not be attempting to reschedule anymore and patient will need to call for an appointment.   Firmin Belisle, NT

## 2022-04-26 NOTE — Progress Notes (Deleted)
New Patient Office Visit  Subjective    Patient ID: Emily Jones, female    DOB: [birth year]  Age: 81 y.o. MRN: [id number]  CC:  No chief complaint on file.   HPI Emily Jones presents to establish care. She does follow with the heart failure clinic but has not had a PCP in sometime.   Hypertension/CHF/Atrial Flutter -Medications: Eliquis 2.5 mg BID, Lasix 20 mg, Lisinopril 2.5 mg, Metoprolol 25 mg BID, Jardiance 10 mg. Nitro PRN. -Patient is compliant with above medications and reports no side effects. -Had been hospitalized in March for CHF exacerbation/leg swelling. Has been taking Lasix 20 mg daily and denies leg edema today. -Denies any SOB, CP, vision changes, LE edema or symptoms of hypotension -Most recent echo from 11/16/21 showing EF 45-50% with mild LVH and moderate mitral regurgitation.  -Did have cath 05/20/20 with PCI placement   -Does follow with Cardiology, note from 01/18/22 reviewed.   HLD: -Medications: Lipitor 80 mg -Patient is compliant with above medications and reports no side effects.  -Last lipid panel: Lipid Panel     Component Value Date/Time   CHOL 154 04/09/2022 1211   TRIG 118 04/09/2022 1211   HDL 53 04/09/2022 1211   CHOLHDL 2.9 04/09/2022 1211   LDLCALC 80 04/09/2022 1211   Diabetes, Type 2: -Last A1c 8.2% 8/23 -Medications: Jardiance increased to 25 mg at [place] -Patient is compliant with the above medications and reports no side effects.  -Checking BG at home: No -Diet: No restrictions  -Eye exam: Due, discuss at follow up  -Foot exam: Due, discuss at follow up  -Microalbumin: Due, discuss at follow up -Statin: yes -PNA vaccine: Due -Denies symptoms of hypoglycemia, polyuria, polydipsia, numbness extremities, foot ulcers/trauma.   GERD: -Currently on Prilosec 20 mg daily   Anemia: -Unknown etiology  -Hgb from the hospital 11/17/21 7.3, on recheck hgb 12.4 8/23, ferritin normal -Denies abnormal bleeding, no blood in stools or  urine -Now on Eliquis for new on-set A.fib started inpatient  -Restarted on PO iron supplements   Hypokalemia: -Potassium 2.9 8/23 -Patient started on potassium supplements KCl 10 meQ taking ?   Outpatient Encounter Medications as of 04/27/2022  Medication Sig   acetaminophen (TYLENOL) 325 MG tablet Take 650 mg by mouth every 6 (six) hours as needed for mild pain.   apixaban (ELIQUIS) 2.5 MG TABS tablet Take 1 tablet (2.5 mg total) by mouth 2 (two) times daily.   atorvastatin (LIPITOR) 80 MG tablet Take 1 tablet (80 mg total) by mouth daily.   empagliflozin (JARDIANCE) 25 MG TABS tablet Take 1 tablet (25 mg total) by mouth daily before breakfast.   ferrous sulfate 325 (65 FE) MG tablet Take 1 tablet (325 mg total) by mouth daily with breakfast.   furosemide (LASIX) 20 MG tablet Take 1 tablet (20 mg total) by mouth daily as needed for fluid or edema.   lisinopril (ZESTRIL) 2.5 MG tablet Take 1 tablet (2.5 mg total) by mouth daily.   metoprolol tartrate (LOPRESSOR) 25 MG tablet Take 1 tablet (25 mg total) by mouth 2 (two) times daily.   nitroGLYCERIN (NITROSTAT) 0.4 MG SL tablet Place 1 tablet (0.4 mg total) under the tongue every 5 (five) minutes as needed for chest pain.   omeprazole (PRILOSEC) 20 MG capsule Take 1 capsule (20 mg total) by mouth daily.   potassium chloride (KLOR-CON M) 10 MEQ tablet Take 1 tablet (10 mEq total) by mouth daily.   No facility-administered encounter medications on  file as of 04/27/2022.    Past Medical History:  Diagnosis Date   Allergic state    Anxiety    CHF (congestive heart failure) ([place])    Coronary artery disease    Costochondritis    Depression    Diabetes mellitus without complication ([place])    Esophagitis    GERD (gastroesophageal reflux disease)    Headache    Hemorrhoids    Hyperlipidemia    Hypertension    Panic attacks     Past Surgical History:  Procedure Laterality Date   APPENDECTOMY     COLONOSCOPY N/A 03/29/2017   Procedure:  COLONOSCOPY;  Surgeon: Manya Silvas, MD;  Location: [facility] ENDOSCOPY;  Service: Endoscopy;  Laterality: N/A;   CORONARY/GRAFT ACUTE MI REVASCULARIZATION N/A 05/20/2020   Procedure: Coronary/Graft Acute MI Revascularization;  Surgeon: Isaias Cowman, MD;  Location: [place] CV LAB;  Service: Cardiovascular;  Laterality: N/A;   DILATION AND CURETTAGE, DIAGNOSTIC / THERAPEUTIC     ESOPHAGOGASTRODUODENOSCOPY (EGD) WITH PROPOFOL N/A 03/29/2017   Procedure: ESOPHAGOGASTRODUODENOSCOPY (EGD) WITH PROPOFOL;  Surgeon: Manya Silvas, MD;  Location: [facility] ENDOSCOPY;  Service: Endoscopy;  Laterality: N/A;   KNEE SURGERY     LEFT HEART CATH AND CORONARY ANGIOGRAPHY N/A 05/20/2020   Procedure: LEFT HEART CATH AND CORONARY ANGIOGRAPHY;  Surgeon: Isaias Cowman, MD;  Location: [place] CV LAB;  Service: Cardiovascular;  Laterality: N/A;    Family History  Problem Relation Age of Onset   Hyperlipidemia Mother    Hypertension Mother    Heart failure Mother    Colon cancer Father    Kidney cancer Brother    Hypertension Maternal Grandmother    Heart failure Maternal Grandmother    Heart attack Neg Hx    Heart disease Neg Hx     Social History   Socioeconomic History   Marital status: Married    Spouse name: Not on file   Number of children: Not on file   Years of education: Not on file   Highest education level: Not on file  Occupational History   Not on file  Tobacco Use   Smoking status: Never   Smokeless tobacco: Never  Vaping Use   Vaping Use: Never used  Substance and Sexual Activity   Alcohol use: No   Drug use: No   Sexual activity: Not Currently  Other Topics Concern   Not on file  Social History Narrative   Not on file   Social Determinants of Health   Financial Resource Strain: Not on file  Food Insecurity: Not on file  Transportation Needs: Not on file  Physical Activity: Not on file  Stress: Not on file  Social Connections: Not on file  Intimate  Partner Violence: Not on file    Review of Systems  Constitutional:  Negative for chills and fever.  Respiratory:  Negative for shortness of breath.   Cardiovascular:  Negative for chest pain and palpitations.  Gastrointestinal:  Negative for blood in stool and melena.  Genitourinary:  Negative for hematuria.     Objective    There were no vitals taken for this visit.  There were no vitals filed for this visit.    Physical Exam Constitutional:      Appearance: Normal appearance.  HENT:     Head: Normocephalic and atraumatic.  Eyes:     Conjunctiva/sclera: Conjunctivae normal.  Cardiovascular:     Rate and Rhythm: Normal rate. Rhythm irregular.  Pulmonary:     Effort: Pulmonary effort  is normal.     Breath sounds: Normal breath sounds.  Musculoskeletal:     Right lower leg: No edema.     Left lower leg: No edema.  Skin:    General: Skin is warm and dry.  Neurological:     General: No focal deficit present.     Mental Status: She is alert. Mental status is at baseline.  Psychiatric:        Mood and Affect: Mood normal.        Behavior: Behavior normal.         Assessment & Plan:   1. Chronic systolic congestive heart failure (HCC)/Atrial flutter, unspecified type [facility]):  Continue Lasix 20 mg, Metoprolol 25 mg BID, Lisinopril 2.5 mg, Eliquis 2.5 mg BID. Patient has been out of some of these medications for some time and/or has not been taking as directed. Appropriate refilled placed today as well as a referral to case management to help with medication compliance and chronic conditions.   - metoprolol tartrate (LOPRESSOR) 25 MG tablet; Take 1 tablet (25 mg total) by mouth 2 (two) times daily.  Dispense: 180 tablet; Refill: 1 - empagliflozin (JARDIANCE) 25 MG TABS tablet; Take 1 tablet (25 mg total) by mouth daily before breakfast.  Dispense: 90 tablet; Refill: 1 - AMB Referral to [place]  2. Coronary artery disease involving native coronary  artery of native heart without angina pectoris: On Eliquis and has a stent placed. Continue Lipitor 80 mg and recheck lipid panel today.   - Lipid Profile - AMB Referral to [facility]  3. Type 2 diabetes mellitus with hyperglycemia, without long-term current use of insulin ([place]): A1c in the hospital 10.3. She is only on Jardiance, which we will increase to 25 mg. Check A1c today. Discussed diabetes etiology and diet changes. Referral to nutrition placed. Follow up in 2 weeks to discuss other medication options.   - Basic Metabolic Panel (BMET) - HgB A1c - empagliflozin (JARDIANCE) 25 MG TABS tablet; Take 1 tablet (25 mg total) by mouth daily before breakfast.  Dispense: 90 tablet; Refill: 1 - Referral to Nutrition and Diabetes Services - AMB Referral to [facility]ordin[facility]  4. GERD without esophagitis: Continue Prilosec 20 mg, refilled today.   - omeprazole (PRILOSEC) 20 MG capsule; Take 1 capsule (20 mg total) by mouth daily.  Dispense: 90 capsule; Refill: 1 - AMB Referral to [place]  5. Anemia, unspecified type: Uncertain etiology, CBC and iron panel today. Was started on ferrous sulfate in the hospital, patient ran out of this medication and needs it refilled.   - CBC w/Diff/Platelet - ferrous sulfate 325 (65 FE) MG tablet; Take 1 tablet (325 mg total) by mouth daily with breakfast.  Dispense: 90 tablet; Refill: 1 - Fe+TIBC+Fer  6. Screening for colon cancer: Referral placed.   - Ambulatory referral to Gastroenterology    No follow-ups on file.   Emily Medici, DO

## 2022-04-27 ENCOUNTER — Ambulatory Visit: Payer: PPO | Admitting: Internal Medicine

## 2022-05-07 ENCOUNTER — Other Ambulatory Visit: Payer: Self-pay | Admitting: Internal Medicine

## 2022-05-07 DIAGNOSIS — E876 Hypokalemia: Secondary | ICD-10-CM

## 2022-05-09 NOTE — Telephone Encounter (Signed)
Requested medication (s) are due for refill today:yes  Requested medication (s) are on the active medication list:yes  Last refill:  04/09/22  Future visit scheduled:no  Notes to clinic:  Unable to refill per protocol due to failed labs, unsure if pt should get  potassium labs rechecked first before getting additional refills. Routing for review.     Requested Prescriptions  Pending Prescriptions Disp Refills   potassium chloride (KLOR-CON M) 10 MEQ tablet [Pharmacy Med Name: POTASSIUM CL MICRO 10MEQ ER TABS] 90 tablet     Sig: TAKE 1 TABLET(10 MEQ) BY MOUTH DAILY     Endocrinology:  Minerals - Potassium Supplementation Failed - 05/07/2022  5:04 PM      Failed - K in normal range and within 360 days    Potassium  Date Value Ref Range Status  04/09/2022 2.9 (L) 3.5 - 5.3 mmol/L Final         Failed - Cr in normal range and within 360 days    Creat  Date Value Ref Range Status  04/09/2022 0.99 (H) 0.60 - 0.95 mg/dL Final         Passed - Valid encounter within last 12 months    Recent Outpatient Visits           1 month ago Type 2 diabetes mellitus with hyperglycemia, without long-term current use of insulin [facility])   [place], [place], DO

## 2022-05-14 ENCOUNTER — Ambulatory Visit (INDEPENDENT_AMBULATORY_CARE_PROVIDER_SITE_OTHER): Payer: PPO | Admitting: Internal Medicine

## 2022-05-14 ENCOUNTER — Encounter: Payer: Self-pay | Admitting: Internal Medicine

## 2022-05-14 VITALS — BP 126/82 | HR 89 | Temp 98.0°F | Resp 16 | Ht 63.75 in | Wt 114.0 lb

## 2022-05-14 DIAGNOSIS — E876 Hypokalemia: Secondary | ICD-10-CM

## 2022-05-14 DIAGNOSIS — Z23 Encounter for immunization: Secondary | ICD-10-CM

## 2022-05-14 DIAGNOSIS — E1165 Type 2 diabetes mellitus with hyperglycemia: Secondary | ICD-10-CM | POA: Diagnosis not present

## 2022-05-14 DIAGNOSIS — J3489 Other specified disorders of nose and nasal sinuses: Secondary | ICD-10-CM | POA: Diagnosis not present

## 2022-05-14 MED ORDER — NASOGEL NA GEL: 1.0000 | Freq: Every day | NASAL | 1 refills | Status: DC

## 2022-05-14 NOTE — Progress Notes (Signed)
Established Patient Office Visit  Subjective    Patient ID: Emily Jones, female    DOB: 1941-02-24  Age: 81 y.o. MRN: [id number]  CC:  Chief Complaint  Patient presents with   Follow-up    labs    HPI Emily Jones presents to for follow up.   Hypertension/CHF/Atrial Flutter -Medications: Eliquis 2.5 mg BID, Lasix 20 mg, Lisinopril 2.5 mg, Metoprolol 25 mg BID, Jardiance 10 mg. Nitro PRN. -Patient is compliant with above medications and reports no side effects. -Had been hospitalized in March for CHF exacerbation/leg swelling. Has been taking Lasix 20 mg daily and denies leg edema today. -Denies any SOB, CP, vision changes, LE edema or symptoms of hypotension -Most recent echo from 11/16/21 showing EF 45-50% with mild LVH and moderate mitral regurgitation.  -Did have cath 05/20/20 with PCI placement   -Does follow with Cardiology, note from 01/18/22 reviewed.   HLD: -Medications: Lipitor 80 mg -Patient is compliant with above medications and reports no side effects.  -Last lipid panel: Lipid Panel     Component Value Date/Time   CHOL 154 04/09/2022 1211   TRIG 118 04/09/2022 1211   HDL 53 04/09/2022 1211   CHOLHDL 2.9 04/09/2022 1211   LDLCALC 80 04/09/2022 1211   Diabetes, Type 2: -Last A1c 8.2% 8/23 -Medications: Jardiance increased to 25 mg at [place] -Patient is compliant with the above medications and reports no side effects.  -Checking BG at home: No -Diet: No restrictions  -Eye exam: Due, discuss at follow up  -Foot exam: Due, discuss at follow up  -Microalbumin: Due otday -Statin: yes -PNA vaccine: Due -Denies symptoms of hypoglycemia, polyuria, polydipsia, numbness extremities, foot ulcers/trauma.   GERD: -Currently on Prilosec 20 mg daily   Anemia: -Unknown etiology  -Hgb from the hospital 11/17/21 7.3, on recheck hgb 12.4 8/23, ferritin normal -Denies abnormal bleeding, no blood in stools or urine -Now on Eliquis for new on-set A.fib started inpatient   -Restarted on PO iron supplements   Hypokalemia: -Potassium 2.9 8/23 -Patient started on potassium supplements KCl 10 meQ but states she is no longer taking.  She noted scabs in her nose that would fall off and bleed while she was on the potassium.  She states since she stopped taking the potassium this has stopped.   Outpatient Encounter Medications as of 05/14/2022  Medication Sig   acetaminophen (TYLENOL) 325 MG tablet Take 650 mg by mouth every 6 (six) hours as needed for mild pain.   apixaban (ELIQUIS) 2.5 MG TABS tablet Take 1 tablet (2.5 mg total) by mouth 2 (two) times daily.   atorvastatin (LIPITOR) 80 MG tablet Take 1 tablet (80 mg total) by mouth daily.   empagliflozin (JARDIANCE) 25 MG TABS tablet Take 1 tablet (25 mg total) by mouth daily before breakfast.   ferrous sulfate 325 (65 FE) MG tablet Take 1 tablet (325 mg total) by mouth daily with breakfast.   furosemide (LASIX) 20 MG tablet Take 1 tablet (20 mg total) by mouth daily as needed for fluid or edema.   lisinopril (ZESTRIL) 2.5 MG tablet Take 1 tablet (2.5 mg total) by mouth daily.   metoprolol tartrate (LOPRESSOR) 25 MG tablet Take 1 tablet (25 mg total) by mouth 2 (two) times daily.   nitroGLYCERIN (NITROSTAT) 0.4 MG SL tablet Place 1 tablet (0.4 mg total) under the tongue every 5 (five) minutes as needed for chest pain.   omeprazole (PRILOSEC) 20 MG capsule Take 1 capsule (20 mg total) by  mouth daily.   potassium chloride (KLOR-CON M) 10 MEQ tablet TAKE 1 TABLET(10 MEQ) BY MOUTH DAILY (Patient not taking: Reported on 05/14/2022)   No facility-administered encounter medications on file as of 05/14/2022.    Past Medical History:  Diagnosis Date   Allergic state    Anxiety    CHF (congestive heart failure) ([place])    Coronary artery disease    Costochondritis    Depression    Diabetes mellitus without complication ([place])    Esophagitis    GERD (gastroesophageal reflux disease)    Headache    Hemorrhoids     Hyperlipidemia    Hypertension    Panic attacks     Past Surgical History:  Procedure Laterality Date   APPENDECTOMY     COLONOSCOPY N/A 03/29/2017   Procedure: COLONOSCOPY;  Surgeon: Manya Silvas, MD;  Location: [facility] ENDOSCOPY;  Service: Endoscopy;  Laterality: N/A;   CORONARY/GRAFT ACUTE MI REVASCULARIZATION N/A 05/20/2020   Procedure: Coronary/Graft Acute MI Revascularization;  Surgeon: Isaias Cowman, MD;  Location: [place] CV LAB;  Service: Cardiovascular;  Laterality: N/A;   DILATION AND CURETTAGE, DIAGNOSTIC / THERAPEUTIC     ESOPHAGOGASTRODUODENOSCOPY (EGD) WITH PROPOFOL N/A 03/29/2017   Procedure: ESOPHAGOGASTRODUODENOSCOPY (EGD) WITH PROPOFOL;  Surgeon: Manya Silvas, MD;  Location: [facility] ENDOSCOPY;  Service: Endoscopy;  Laterality: N/A;   KNEE SURGERY     LEFT HEART CATH AND CORONARY ANGIOGRAPHY N/A 05/20/2020   Procedure: LEFT HEART CATH AND CORONARY ANGIOGRAPHY;  Surgeon: Isaias Cowman, MD;  Location: [place] CV LAB;  Service: Cardiovascular;  Laterality: N/A;    Family History  Problem Relation Age of Onset   Hyperlipidemia Mother    Hypertension Mother    Heart failure Mother    Colon cancer Father    Kidney cancer Brother    Hypertension Maternal Grandmother    Heart failure Maternal Grandmother    Heart attack Neg Hx    Heart disease Neg Hx     Social History   Socioeconomic History   Marital status: Married    Spouse name: Not on file   Number of children: Not on file   Years of education: Not on file   Highest education level: Not on file  Occupational History   Not on file  Tobacco Use   Smoking status: Never   Smokeless tobacco: Never  Vaping Use   Vaping Use: Never used  Substance and Sexual Activity   Alcohol use: No   Drug use: No   Sexual activity: Not Currently  Other Topics Concern   Not on file  Social History Narrative   Not on file   Social Determinants of Health   Financial Resource Strain: Not on  file  Food Insecurity: Not on file  Transportation Needs: Not on file  Physical Activity: Not on file  Stress: Not on file  Social Connections: Not on file  Intimate Partner Violence: Not on file    Review of Systems  Constitutional:  Negative for chills and fever.  Respiratory:  Negative for shortness of breath.   Cardiovascular:  Negative for chest pain and palpitations.  Gastrointestinal:  Negative for blood in stool and melena.  Genitourinary:  Negative for hematuria.     Objective    BP 126/82   Pulse 89   Temp 98 F (36.7 C)   Resp 16   Ht 5' 3.75" (1.619 m)   Wt 114 lb (51.7 kg)   SpO2 97%   BMI 19.72 kg/m  Vitals:   05/14/22 1411  BP: 126/82    Physical Exam Constitutional:      Appearance: Normal appearance.  HENT:     Head: Normocephalic and atraumatic.     Nose: Nose normal.  Eyes:     Conjunctiva/sclera: Conjunctivae normal.  Cardiovascular:     Rate and Rhythm: Normal rate. Rhythm irregular.  Pulmonary:     Effort: Pulmonary effort is normal.     Breath sounds: Normal breath sounds.  Musculoskeletal:     Right lower leg: No edema.     Left lower leg: No edema.  Skin:    General: Skin is warm and dry.  Neurological:     General: No focal deficit present.     Mental Status: She is alert. Mental status is at baseline.  Psychiatric:        Mood and Affect: Mood normal.        Behavior: Behavior normal.         Assessment & Plan:   1. Hypokalemia: Potassium in August 2 0.9.  She is on Lasix no other medications that can reduce her potassium.  I sent 10 potassium chloride 20 mEq,, however the patient is not currently taking this.  We will recheck her potassium levels today and restart potassium supplements if this is again low.  Follow-up in 3 weeks to recheck potassium.  - Basic Metabolic Panel (BMET)  2. Type 2 diabetes mellitus with hyperglycemia, without long-term current use of insulin ([place]): A1c improved, urine microalbumin  today.  - Urine Microalbumin w/creat. ratio  3. Dry nose: Patient is saying the dry nose was a side effect of her potassium supplements, will send nasal gel to her pharmacy to keep nasal mucosa moist.  - Saline (NASOGEL) GEL; Place 1 spray into the nose daily.  Dispense: 28.4 g; Refill: 1  4. Vaccine for streptococcus pneumoniae and influenza: Flu and Prevnar 20 vaccines administered today.  - Pneumococcal conjugate vaccine 20-valent (Prevnar 20) - Flu Vaccine QUAD High Dose(Fluad)   Return in about 3 weeks (around 06/04/2022).   Teodora Medici, DO

## 2022-05-14 NOTE — Patient Instructions (Addendum)
It was great seeing you today!  Plan discussed at today's visit: -Blood work ordered today, results will be uploaded to [place].  -I will call you if you need to restart the potassium  -Plan to come back in 3 weeks to recheck labs -Try Nasogel for nasal dryness   Follow up in: 3 weeks  Take care and let us know if you have any questions or concerns prior to your next visit.  Dr. Rosana Berger

## 2022-05-15 LAB — BASIC METABOLIC PANEL
BUN/Creatinine Ratio: 14 (calc) (ref 6–22)
BUN: 14 mg/dL (ref 7–25)
CO2: 27 mmol/L (ref 20–32)
Calcium: 9 mg/dL (ref 8.6–10.4)
Chloride: 98 mmol/L (ref 98–110)
Creat: 1.02 mg/dL — ABNORMAL HIGH (ref 0.60–0.95)
Glucose, Bld: 219 mg/dL — ABNORMAL HIGH (ref 65–99)
Potassium: 3.3 mmol/L — ABNORMAL LOW (ref 3.5–5.3)
Sodium: 137 mmol/L (ref 135–146)

## 2022-05-15 LAB — MICROALBUMIN / CREATININE URINE RATIO
Creatinine, Urine: 119 mg/dL (ref 20–275)
Microalb Creat Ratio: 76 mcg/mg creat — ABNORMAL HIGH (ref <30)
Microalb, Ur: 9.1 mg/dL

## 2022-05-28 ENCOUNTER — Ambulatory Visit: Payer: PPO | Admitting: Internal Medicine

## 2022-06-05 ENCOUNTER — Ambulatory Visit: Payer: PPO | Admitting: Internal Medicine

## 2022-06-05 NOTE — Progress Notes (Deleted)
Established Patient Office Visit  Subjective    Patient ID: Emily Jones, female    DOB: 09-Aug-1941  Age: 81 y.o. MRN: [id number]  CC:  No chief complaint on file.   HPI Emily Jones presents to for follow up.   Hypertension/CHF/Atrial Flutter -Medications: Eliquis 2.5 mg BID, Lasix 20 mg, Lisinopril 2.5 mg, Metoprolol 25 mg BID, Jardiance 10 mg. Nitro PRN. -Patient is compliant with above medications and reports no side effects. -Had been hospitalized in March for CHF exacerbation/leg swelling. Has been taking Lasix 20 mg daily and denies leg edema today. -Denies any SOB, CP, vision changes, LE edema or symptoms of hypotension -Most recent echo from 11/16/21 showing EF 45-50% with mild LVH and moderate mitral regurgitation.  -Did have cath 05/20/20 with PCI placement   -Does follow with Cardiology, note from 01/18/22 reviewed.   HLD: -Medications: Lipitor 80 mg -Patient is compliant with above medications and reports no side effects.  -Last lipid panel: Lipid Panel     Component Value Date/Time   CHOL 154 04/09/2022 1211   TRIG 118 04/09/2022 1211   HDL 53 04/09/2022 1211   CHOLHDL 2.9 04/09/2022 1211   LDLCALC 80 04/09/2022 1211   Diabetes, Type 2: -Last A1c 8.2% 8/23 -Medications: Jardiance increased to 25 mg at [facility] -Patient is compliant with the above medications and reports no side effects.  -Checking BG at home: No -Diet: No restrictions  -Eye exam: Due, discuss at follow up  -Foot exam: Due, discuss at follow up  -Microalbumin: Due otday -Statin: yes -PNA vaccine: Due -Denies symptoms of hypoglycemia, polyuria, polydipsia, numbness extremities, foot ulcers/trauma.   GERD: -Currently on Prilosec 20 mg daily   Anemia: -Unknown etiology  -Hgb from the hospital 11/17/21 7.3, on recheck hgb 12.4 8/23, ferritin normal -Denies abnormal bleeding, no blood in stools or urine -Now on Eliquis for new on-set A.fib started inpatient  -Restarted on PO iron supplements    Hypokalemia: -Potassium 2.9 8/23, recheck 3.3 -Patient started on potassium supplements KCl 10 meQ but states she is no longer taking.  She noted scabs in her nose that would fall off and bleed while she was on the potassium.  She states since she stopped taking the potassium this has stopped. Patient called with last lab results and was told to take potassium supplement every other day.    Outpatient Encounter Medications as of 06/05/2022  Medication Sig   acetaminophen (TYLENOL) 325 MG tablet Take 650 mg by mouth every 6 (six) hours as needed for mild pain.   apixaban (ELIQUIS) 2.5 MG TABS tablet Take 1 tablet (2.5 mg total) by mouth 2 (two) times daily.   atorvastatin (LIPITOR) 80 MG tablet Take 1 tablet (80 mg total) by mouth daily.   empagliflozin (JARDIANCE) 25 MG TABS tablet Take 1 tablet (25 mg total) by mouth daily before breakfast.   ferrous sulfate 325 (65 FE) MG tablet Take 1 tablet (325 mg total) by mouth daily with breakfast.   furosemide (LASIX) 20 MG tablet Take 1 tablet (20 mg total) by mouth daily as needed for fluid or edema.   lisinopril (ZESTRIL) 2.5 MG tablet Take 1 tablet (2.5 mg total) by mouth daily.   metoprolol tartrate (LOPRESSOR) 25 MG tablet Take 1 tablet (25 mg total) by mouth 2 (two) times daily.   nitroGLYCERIN (NITROSTAT) 0.4 MG SL tablet Place 1 tablet (0.4 mg total) under the tongue every 5 (five) minutes as needed for chest pain.   omeprazole (PRILOSEC)  20 MG capsule Take 1 capsule (20 mg total) by mouth daily.   potassium chloride (KLOR-CON M) 10 MEQ tablet TAKE 1 TABLET(10 MEQ) BY MOUTH DAILY (Patient not taking: Reported on 05/14/2022)   Saline (NASOGEL) GEL Place 1 spray into the nose daily.   No facility-administered encounter medications on file as of 06/05/2022.    Past Medical History:  Diagnosis Date   Allergic state    Anxiety    CHF (congestive heart failure) ([place])    Coronary artery disease    Costochondritis    Depression    Diabetes  mellitus without complication ([place])    Esophagitis    GERD (gastroesophageal reflux disease)    Headache    Hemorrhoids    Hyperlipidemia    Hypertension    Panic attacks     Past Surgical History:  Procedure Laterality Date   APPENDECTOMY     COLONOSCOPY N/A 03/29/2017   Procedure: COLONOSCOPY;  Surgeon: Manya Silvas, MD;  Location: [facility] ENDOSCOPY;  Service: Endoscopy;  Laterality: N/A;   CORONARY/GRAFT ACUTE MI REVASCULARIZATION N/A 05/20/2020   Procedure: Coronary/Graft Acute MI Revascularization;  Surgeon: Isaias Cowman, MD;  Location: [place] CV LAB;  Service: Cardiovascular;  Laterality: N/A;   DILATION AND CURETTAGE, DIAGNOSTIC / THERAPEUTIC     ESOPHAGOGASTRODUODENOSCOPY (EGD) WITH PROPOFOL N/A 03/29/2017   Procedure: ESOPHAGOGASTRODUODENOSCOPY (EGD) WITH PROPOFOL;  Surgeon: Manya Silvas, MD;  Location: [facility] ENDOSCOPY;  Service: Endoscopy;  Laterality: N/A;   KNEE SURGERY     LEFT HEART CATH AND CORONARY ANGIOGRAPHY N/A 05/20/2020   Procedure: LEFT HEART CATH AND CORONARY ANGIOGRAPHY;  Surgeon: Isaias Cowman, MD;  Location: [place] CV LAB;  Service: Cardiovascular;  Laterality: N/A;    Family History  Problem Relation Age of Onset   Hyperlipidemia Mother    Hypertension Mother    Heart failure Mother    Colon cancer Father    Kidney cancer Brother    Hypertension Maternal Grandmother    Heart failure Maternal Grandmother    Heart attack Neg Hx    Heart disease Neg Hx     Social History   Socioeconomic History   Marital status: Married    Spouse name: Not on file   Number of children: Not on file   Years of education: Not on file   Highest education level: Not on file  Occupational History   Not on file  Tobacco Use   Smoking status: Never   Smokeless tobacco: Never  Vaping Use   Vaping Use: Never used  Substance and Sexual Activity   Alcohol use: No   Drug use: No   Sexual activity: Not Currently  Other Topics Concern    Not on file  Social History Narrative   Not on file   Social Determinants of Health   Financial Resource Strain: Not on file  Food Insecurity: Not on file  Transportation Needs: Not on file  Physical Activity: Not on file  Stress: Not on file  Social Connections: Not on file  Intimate Partner Violence: Not on file    Review of Systems  Constitutional:  Negative for chills and fever.  Respiratory:  Negative for shortness of breath.   Cardiovascular:  Negative for chest pain and palpitations.  Gastrointestinal:  Negative for blood in stool and melena.  Genitourinary:  Negative for hematuria.     Objective    There were no vitals taken for this visit.  There were no vitals filed for this visit.  Physical Exam Constitutional:      Appearance: Normal appearance.  HENT:     Head: Normocephalic and atraumatic.     Nose: Nose normal.  Eyes:     Conjunctiva/sclera: Conjunctivae normal.  Cardiovascular:     Rate and Rhythm: Normal rate. Rhythm irregular.  Pulmonary:     Effort: Pulmonary effort is normal.     Breath sounds: Normal breath sounds.  Musculoskeletal:     Right lower leg: No edema.     Left lower leg: No edema.  Skin:    General: Skin is warm and dry.  Neurological:     General: No focal deficit present.     Mental Status: She is alert. Mental status is at baseline.  Psychiatric:        Mood and Affect: Mood normal.        Behavior: Behavior normal.         Assessment & Plan:   1. Hypokalemia: Potassium in August 2 0.9.  She is on Lasix no other medications that can reduce her potassium.  I sent 10 potassium chloride 20 mEq,, however the patient is not currently taking this.  We will recheck her potassium levels today and restart potassium supplements if this is again low.  Follow-up in 3 weeks to recheck potassium.  - Basic Metabolic Panel (BMET)  2. Type 2 diabetes mellitus with hyperglycemia, without long-term current use of insulin ([place]): A1c  improved, urine microalbumin today.  - Urine Microalbumin w/creat. ratio  3. Dry nose: Patient is saying the dry nose was a side effect of her potassium supplements, will send nasal gel to her pharmacy to keep nasal mucosa moist.  - Saline (NASOGEL) GEL; Place 1 spray into the nose daily.  Dispense: 28.4 g; Refill: 1  4. Vaccine for streptococcus pneumoniae and influenza: Flu and Prevnar 20 vaccines administered today.  - Pneumococcal conjugate vaccine 20-valent (Prevnar 20) - Flu Vaccine QUAD High Dose(Fluad)   No follow-ups on file.   Teodora Medici, DO

## 2022-07-24 IMAGING — CR DG CHEST 2V
1 series · 2 of 2 positions shown · non-contrast
Comparison: None.

CLINICAL DATA: Central chest pain for several hours

EXAM:
CHEST - 2 VIEW

[Series 1: dg chest 2 view · 0.14mm/px · 2 of 2 slices shown]
[im 1/2]
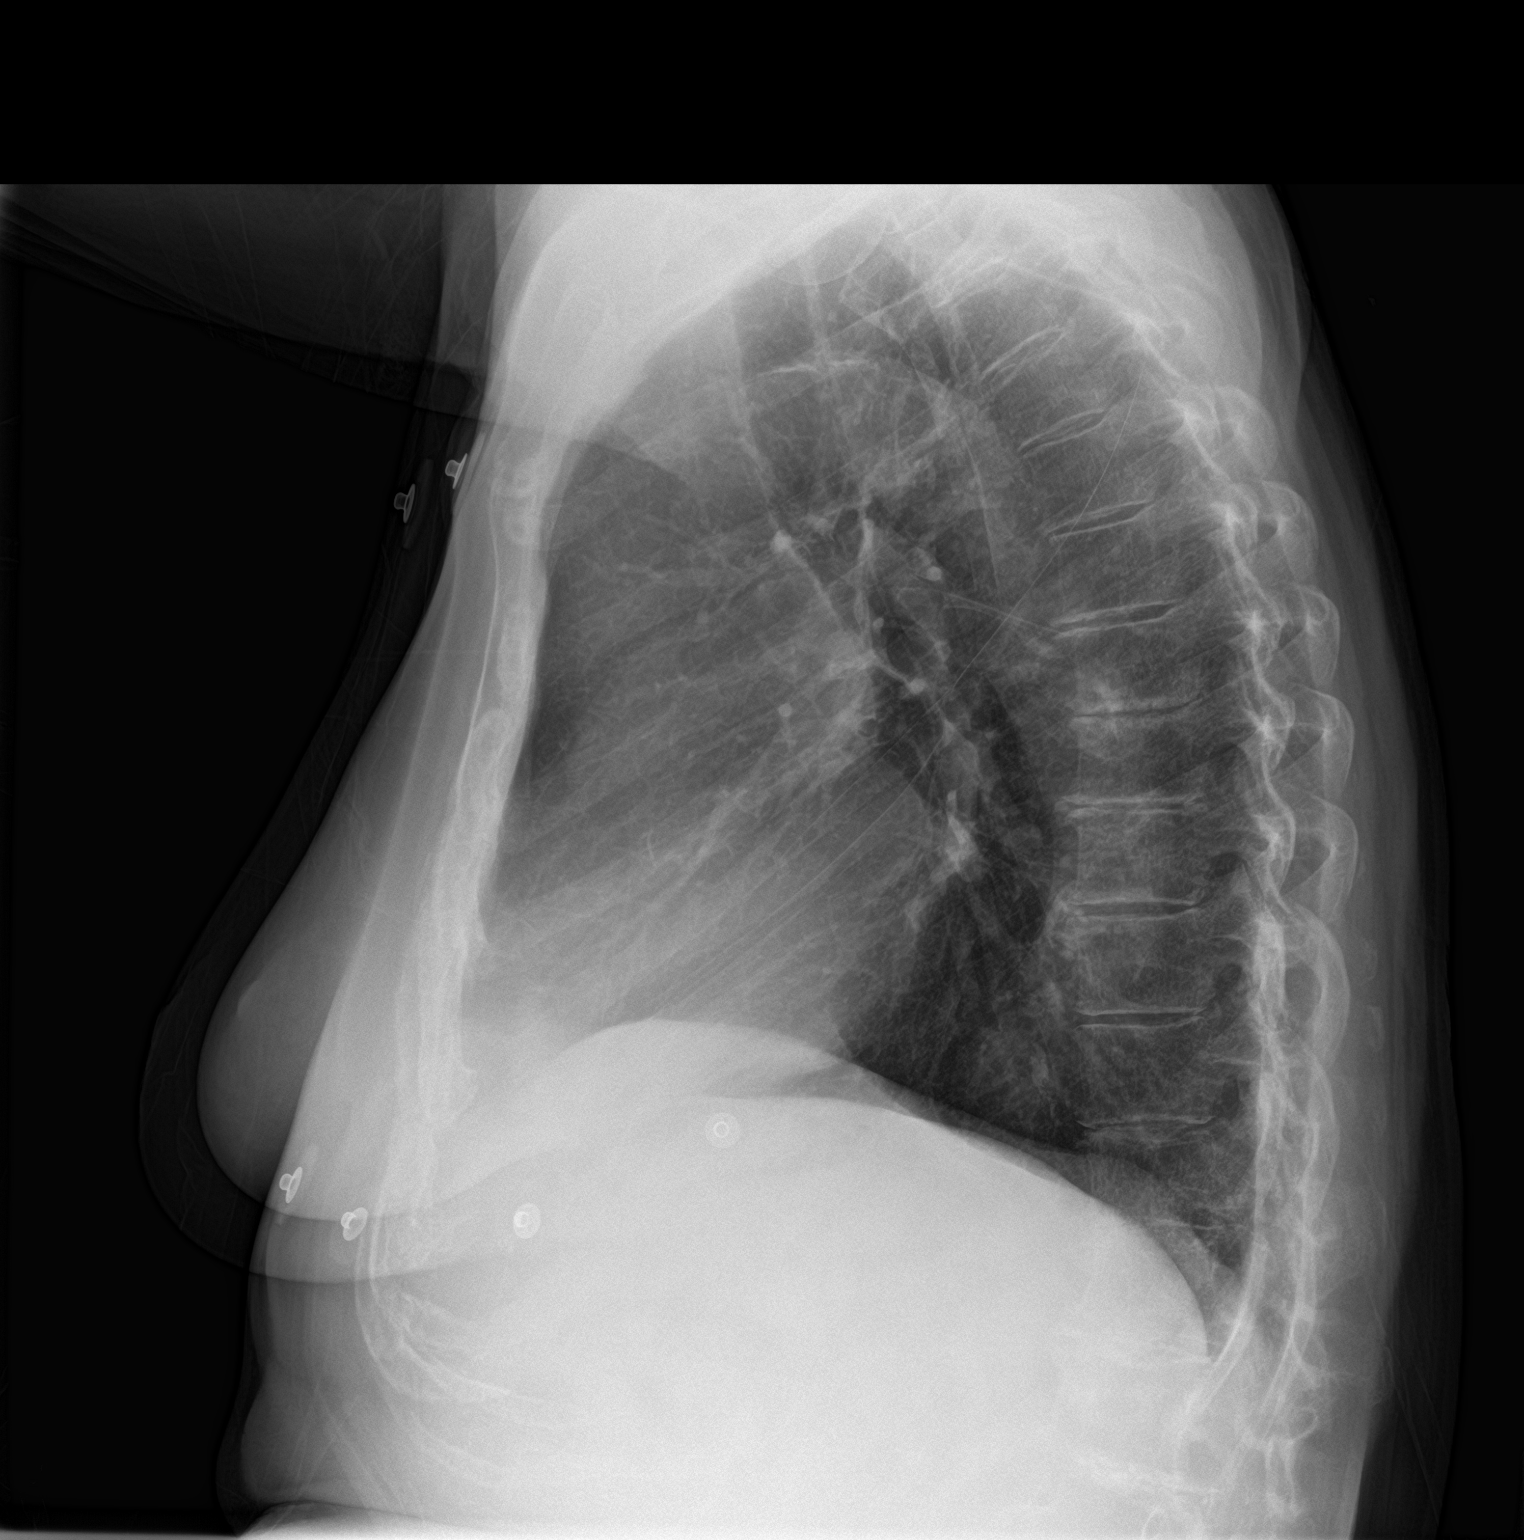
[im 2/2]
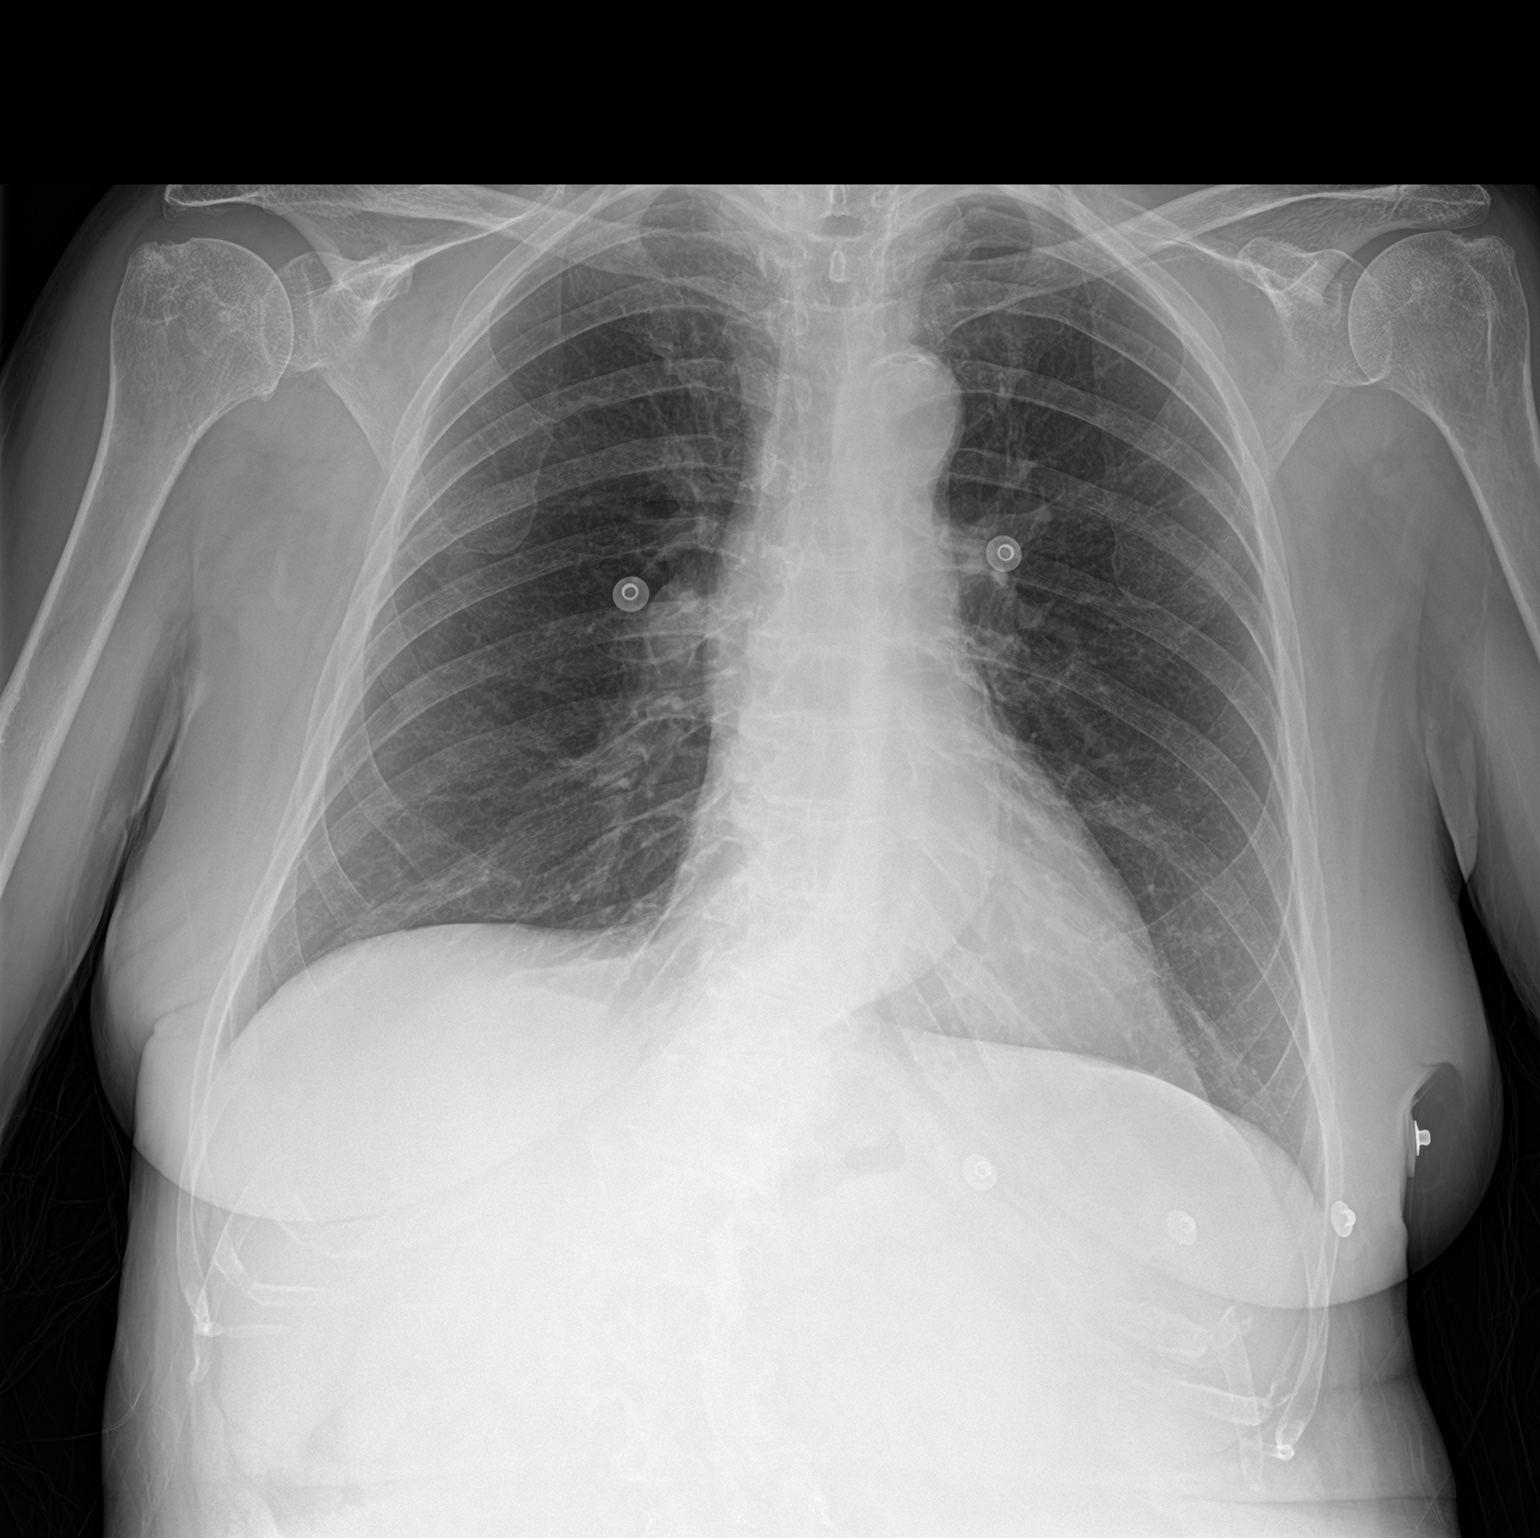

[2 of 2 positions shown; findings below may reference images not displayed]

FINDINGS: Cardiac shadow is within normal limits. Aortic calcifications are
seen. Lungs are well aerated bilaterally. No acute bony abnormality
is seen.
IMPRESSION: No acute abnormality noted.

## 2022-10-19 ENCOUNTER — Other Ambulatory Visit: Payer: Self-pay | Admitting: Internal Medicine

## 2022-10-19 DIAGNOSIS — K219 Gastro-esophageal reflux disease without esophagitis: Secondary | ICD-10-CM

## 2022-10-19 NOTE — Telephone Encounter (Signed)
Requested Prescriptions  Pending Prescriptions Disp Refills   omeprazole (PRILOSEC) 20 MG capsule [Pharmacy Med Name: OMEPRAZOLE '20MG'$  CAPSULES] 90 capsule 1    Sig: TAKE 1 CAPSULE(20 MG) BY MOUTH DAILY     Gastroenterology: Proton Pump Inhibitors Passed - 10/19/2022  6:37 AM      Passed - Valid encounter within last 12 months    Recent Outpatient Visits           5 months ago Hypokalemia   [facility] Teodora Medici, DO   6 months ago Type 2 diabetes mellitus with hyperglycemia, without long-term current use of insulin [facility])   [facility] Teodora Medici, [place]

## 2022-10-31 ENCOUNTER — Other Ambulatory Visit: Payer: Self-pay | Admitting: Internal Medicine

## 2022-10-31 DIAGNOSIS — E876 Hypokalemia: Secondary | ICD-10-CM

## 2022-10-31 DIAGNOSIS — I5022 Chronic systolic (congestive) heart failure: Secondary | ICD-10-CM

## 2022-10-31 NOTE — Telephone Encounter (Signed)
Requested Prescriptions  Pending Prescriptions Disp Refills   metoprolol tartrate (LOPRESSOR) 25 MG tablet [Pharmacy Med Name: METOPROLOL TARTRATE '25MG'$  TABLETS] 180 tablet 1    Sig: TAKE 1 TABLET(25 MG) BY MOUTH TWICE DAILY     Cardiovascular:  Beta Blockers Passed - 10/31/2022  8:48 AM      Passed - Last BP in normal range    BP Readings from Last 1 Encounters:  05/14/22 126/82         Passed - Last Heart Rate in normal range    Pulse Readings from Last 1 Encounters:  05/14/22 89         Passed - Valid encounter within last 6 months    Recent Outpatient Visits           5 months ago Hypokalemia   [facility] Teodora Medici, DO   6 months ago Type 2 diabetes mellitus with hyperglycemia, without long-term current use of insulin [facility])   [facility] Teodora Medici, DO               potassium chloride (KLOR-CON M) 10 MEQ tablet [Pharmacy Med Name: POTASSIUM CL MICRO 10MEQ ER TABS] 90 tablet 0    Sig: TAKE 1 TABLET(10 MEQ) BY MOUTH DAILY     Endocrinology:  Minerals - Potassium Supplementation Failed - 10/31/2022  8:48 AM      Failed - K in normal range and within 360 days    Potassium  Date Value Ref Range Status  05/14/2022 3.3 (L) 3.5 - 5.3 mmol/L Final         Failed - Cr in normal range and within 360 days    Creat  Date Value Ref Range Status  05/14/2022 1.02 (H) 0.60 - 0.95 mg/dL Final   Creatinine, Urine  Date Value Ref Range Status  05/14/2022 119 20 - 275 mg/dL Final         Passed - Valid encounter within last 12 months    Recent Outpatient Visits           5 months ago Hypokalemia   [facility] Teodora Medici, DO   6 months ago Type 2 diabetes mellitus with hyperglycemia, without long-term current use of insulin [facility])   [place], [place]

## 2022-11-01 NOTE — Telephone Encounter (Signed)
Requested Prescriptions  Pending Prescriptions Disp Refills   potassium chloride (KLOR-CON M) 10 MEQ tablet [Pharmacy Med Name: POTASSIUM CL MICRO 10MEQ ER TABS] 90 tablet     Sig: TAKE 1 TABLET(10 MEQ) BY MOUTH DAILY     Endocrinology:  Minerals - Potassium Supplementation Failed - 10/31/2022  2:09 PM      Failed - K in normal range and within 360 days    Potassium  Date Value Ref Range Status  05/14/2022 3.3 (L) 3.5 - 5.3 mmol/L Final         Failed - Cr in normal range and within 360 days    Creat  Date Value Ref Range Status  05/14/2022 1.02 (H) 0.60 - 0.95 mg/dL Final   Creatinine, Urine  Date Value Ref Range Status  05/14/2022 119 20 - 275 mg/dL Final         Passed - Valid encounter within last 12 months    Recent Outpatient Visits           5 months ago Hypokalemia   [facility] Teodora Medici, DO   6 months ago Type 2 diabetes mellitus with hyperglycemia, without long-term current use of insulin [facility])   [facility] Teodora Medici, DO               metoprolol tartrate (LOPRESSOR) 25 MG tablet [Pharmacy Med Name: METOPROLOL TARTRATE '25MG'$  TABLETS] 180 tablet     Sig: TAKE 1 TABLET(25 MG) BY MOUTH TWICE DAILY     Cardiovascular:  Beta Blockers Passed - 10/31/2022  2:09 PM      Passed - Last BP in normal range    BP Readings from Last 1 Encounters:  05/14/22 126/82         Passed - Last Heart Rate in normal range    Pulse Readings from Last 1 Encounters:  05/14/22 89         Passed - Valid encounter within last 6 months    Recent Outpatient Visits           5 months ago Hypokalemia   [facility] Teodora Medici, DO   6 months ago Type 2 diabetes mellitus with hyperglycemia, without long-term current use of insulin [facility])   [place], [place]

## 2022-12-03 ENCOUNTER — Telehealth: Payer: Self-pay | Admitting: Pharmacist

## 2022-12-03 NOTE — Progress Notes (Signed)
   12/03/2022  Patient ID: Emily Jones, female   DOB: September 10, 1940, 82 y.o.   MRN: [id number]  Outreach to patient today regarding cholesterol, diabetes and hypertension medication adherence as requested by patient's health plan.  Per review of chart, no next appointment with PCP is currently scheduled.  Was unable to reach patient via telephone today and unable to leave a message as no voicemail picks up.    Estelle Grumbles, PharmD, [facility] [phone]

## 2022-12-07 ENCOUNTER — Other Ambulatory Visit: Payer: Self-pay | Admitting: Internal Medicine

## 2022-12-07 DIAGNOSIS — E876 Hypokalemia: Secondary | ICD-10-CM

## 2022-12-07 DIAGNOSIS — I5022 Chronic systolic (congestive) heart failure: Secondary | ICD-10-CM

## 2022-12-07 NOTE — Telephone Encounter (Signed)
Requested Prescriptions  Pending Prescriptions Disp Refills   metoprolol tartrate (LOPRESSOR) 25 MG tablet [Pharmacy Med Name: METOPROLOL TARTRATE 25MG  TABLETS] 180 tablet 0    Sig: TAKE 1 TABLET(25 MG) BY MOUTH TWICE DAILY     Cardiovascular:  Beta Blockers Failed - 12/07/2022 11:09 AM      Failed - Valid encounter within last 6 months    Recent Outpatient Visits           6 months ago Hypokalemia   [facility] Margarita Mail, DO   8 months ago Type 2 diabetes mellitus with hyperglycemia, without long-term current use of insulin [facility])   [facility] Margarita Mail, [place]              Passed - Last BP in normal range    BP Readings from Last 1 Encounters:  05/14/22 126/82         Passed - Last Heart Rate in normal range    Pulse Readings from Last 1 Encounters:  05/14/22 89          potassium chloride (KLOR-CON M) 10 MEQ tablet [Pharmacy Med Name: POTASSIUM CL MICRO ER TABS] 90 tablet     Sig: TAKE 1 TABLET(10 MEQ) BY MOUTH DAILY     Endocrinology:  Minerals - Potassium Supplementation Failed - 12/07/2022 11:09 AM      Failed - K in normal range and within 360 days    Potassium  Date Value Ref Range Status  05/14/2022 3.3 (L) 3.5 - 5.3 mmol/L Final         Failed - Cr in normal range and within 360 days    Creat  Date Value Ref Range Status  05/14/2022 1.02 (H) 0.60 - 0.95 mg/dL Final   Creatinine, Urine  Date Value Ref Range Status  05/14/2022 119 20 - 275 mg/dL Final         Passed - Valid encounter within last 12 months    Recent Outpatient Visits           6 months ago Hypokalemia   [facility] Margarita Mail, DO   8 months ago Type 2 diabetes mellitus with hyperglycemia, without long-term current use of insulin [facility])   [facility] Margarita Mail, [place]

## 2023-03-22 ENCOUNTER — Other Ambulatory Visit: Payer: Self-pay | Admitting: Internal Medicine

## 2023-03-22 DIAGNOSIS — I5022 Chronic systolic (congestive) heart failure: Secondary | ICD-10-CM

## 2023-03-22 DIAGNOSIS — E876 Hypokalemia: Secondary | ICD-10-CM

## 2023-03-22 NOTE — Telephone Encounter (Signed)
Requested Prescriptions  Pending Prescriptions Disp Refills   metoprolol tartrate (LOPRESSOR) 25 MG tablet [Pharmacy Med Name: METOPROLOL TARTRATE 25MG  TABLETS] 180 tablet 0    Sig: TAKE 1 TABLET(25 MG) BY MOUTH TWICE DAILY     Cardiovascular:  Beta Blockers Failed - 03/22/2023 11:36 AM      Failed - Valid encounter within last 6 months    Recent Outpatient Visits           10 months ago Hypokalemia   [facility] Margarita Mail, DO   11 months ago Type 2 diabetes mellitus with hyperglycemia, without long-term current use of insulin [facility])   [facility] Margarita Mail, [place]              Passed - Last BP in normal range    BP Readings from Last 1 Encounters:  05/14/22 126/82         Passed - Last Heart Rate in normal range    Pulse Readings from Last 1 Encounters:  05/14/22 89          potassium chloride (KLOR-CON M) 10 MEQ tablet [Pharmacy Med Name: POTASSIUM CL MICRO ER TABS] 90 tablet 0    Sig: TAKE 1 TABLET(10 MEQ) BY MOUTH DAILY     Endocrinology:  Minerals - Potassium Supplementation Failed - 03/22/2023 11:36 AM      Failed - K in normal range and within 360 days    Potassium  Date Value Ref Range Status  05/14/2022 3.3 (L) 3.5 - 5.3 mmol/L Final         Failed - Cr in normal range and within 360 days    Creat  Date Value Ref Range Status  05/14/2022 1.02 (H) 0.60 - 0.95 mg/dL Final   Creatinine, Urine  Date Value Ref Range Status  05/14/2022 119 20 - 275 mg/dL Final         Passed - Valid encounter within last 12 months    Recent Outpatient Visits           10 months ago Hypokalemia   [facility] Margarita Mail, DO   11 months ago Type 2 diabetes mellitus with hyperglycemia, without long-term current use of insulin [facility])   [facility] Margarita Mail, [place]

## 2023-04-17 ENCOUNTER — Other Ambulatory Visit: Payer: Self-pay | Admitting: Internal Medicine

## 2023-04-17 DIAGNOSIS — K219 Gastro-esophageal reflux disease without esophagitis: Secondary | ICD-10-CM

## 2023-04-18 NOTE — Telephone Encounter (Signed)
Requested Prescriptions  Pending Prescriptions Disp Refills   omeprazole (PRILOSEC) 20 MG capsule [Pharmacy Med Name: OMEPRAZOLE 20MG  CAPSULES] 30 capsule 0    Sig: TAKE 1 CAPSULE(20 MG) BY MOUTH DAILY     Gastroenterology: Proton Pump Inhibitors Passed - 04/17/2023  6:33 AM      Passed - Valid encounter within last 12 months    Recent Outpatient Visits           11 months ago Hypokalemia   [facility] Margarita Mail, DO   1 year ago Type 2 diabetes mellitus with hyperglycemia, without long-term current use of insulin [facility])   [facility] Margarita Mail, [place]

## 2023-06-28 DEATH — deceased

## 2024-01-21 IMAGING — CT CT ANGIO CHEST
2 of 6 series · 18 of 46 positions shown · IV contrast (APPLIED)
Comparison: None.

CLINICAL DATA: Pulmonary embolism (PE) suspected, positive D-dimer.
Dyspnea, leg swelling.

EXAM:
CT ANGIOGRAPHY CHEST WITH CONTRAST
TECHNIQUE: Multidetector CT imaging of the chest was performed using the
standard protocol during bolus administration of intravenous
contrast. Multiplanar CT image reconstructions and MIPs were
obtained to evaluate the vascular anatomy.

[Series 7: thins · axial · 0.67mm/px · z∈[-894,-643]mm · 15 of 395 slices shown]
[im 18/395  lung]
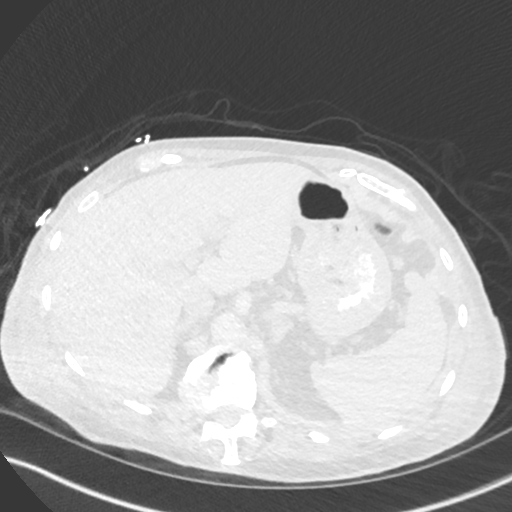
[im 52/395  soft-tissue]
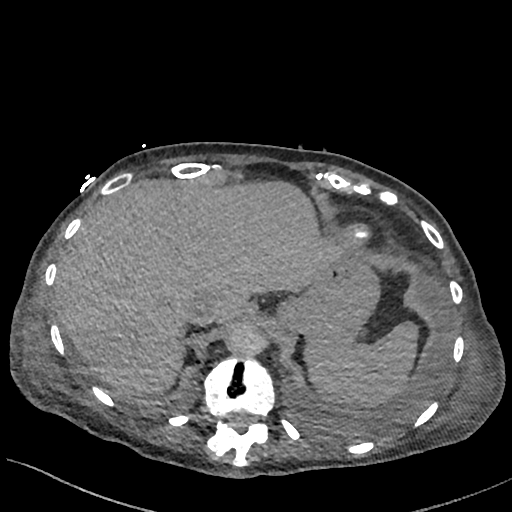
[im 69/395  lung]
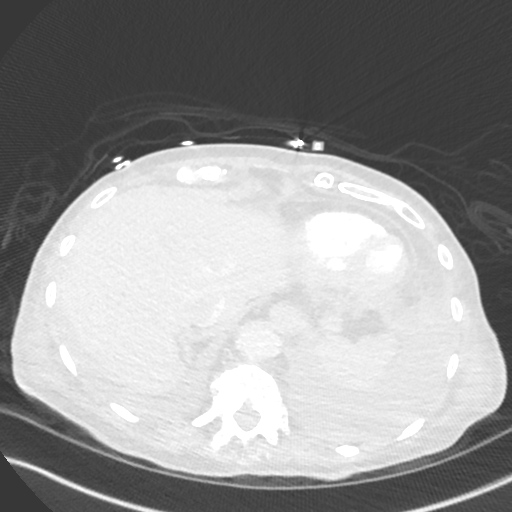
[im 103/395  soft-tissue]
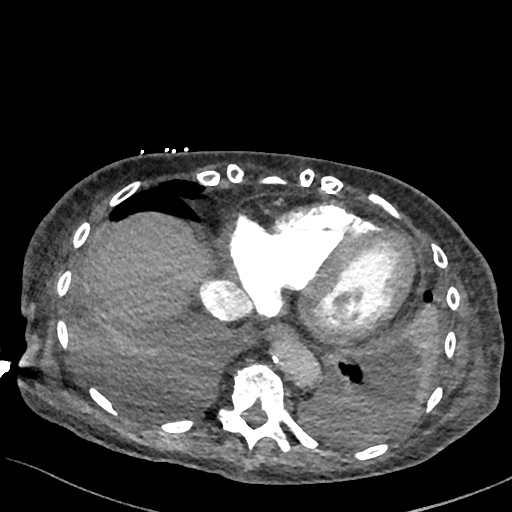
[im 120/395  lung]
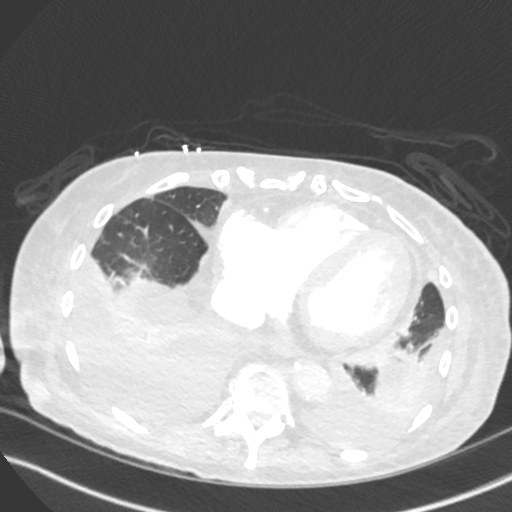
[im 155/395  soft-tissue]
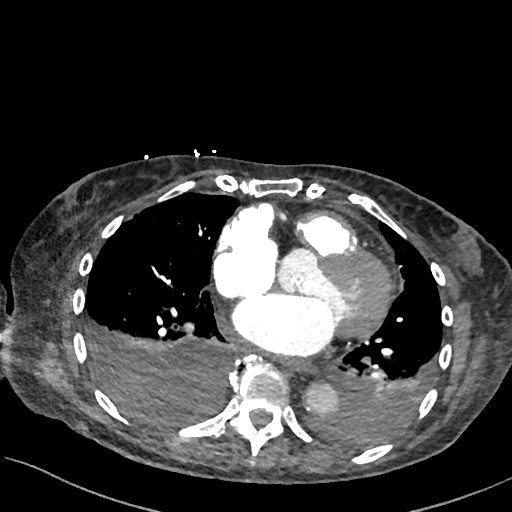
[im 172/395  lung]
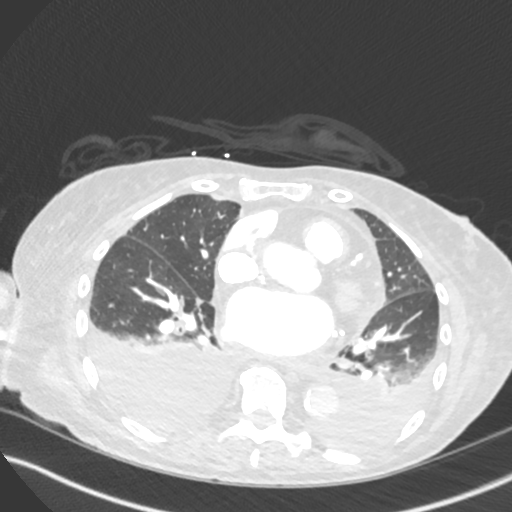
[im 206/395  soft-tissue]
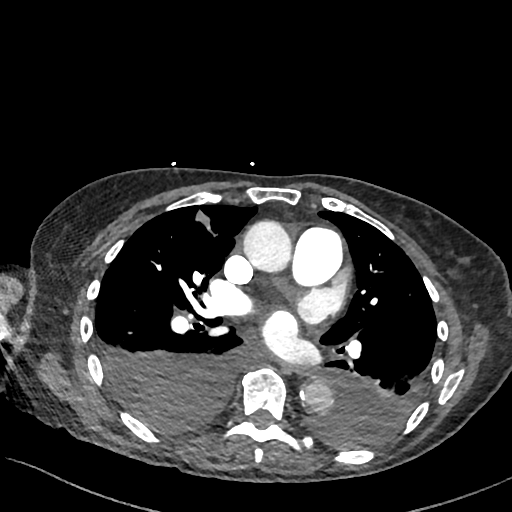
[im 223/395  lung]
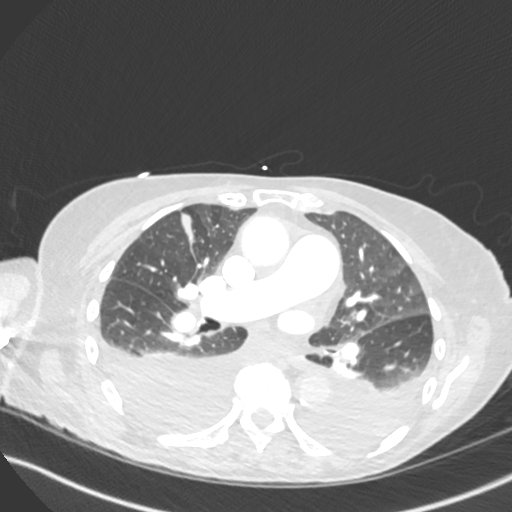
[im 240/395  soft-tissue]
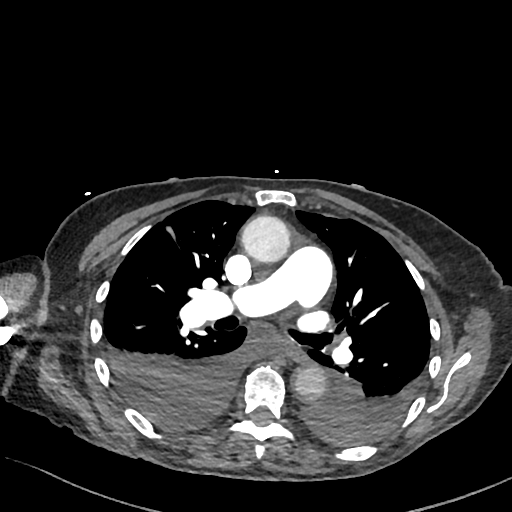
[im 275/395  lung]
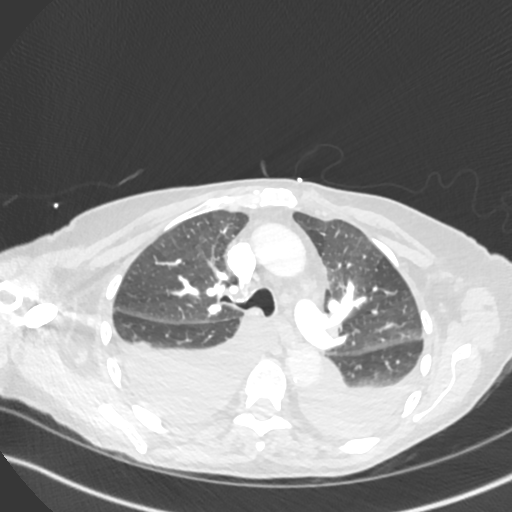
[im 292/395  soft-tissue]
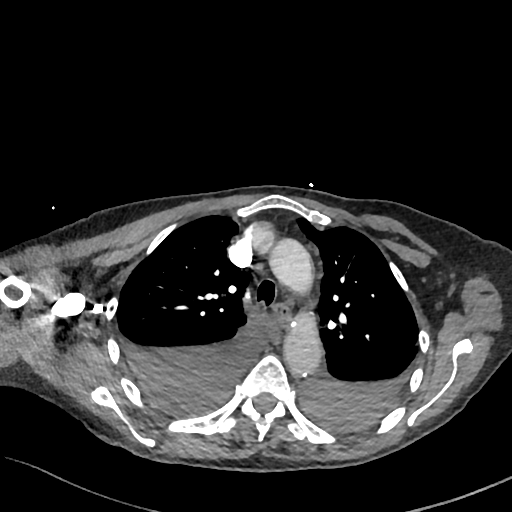
[im 326/395  lung]
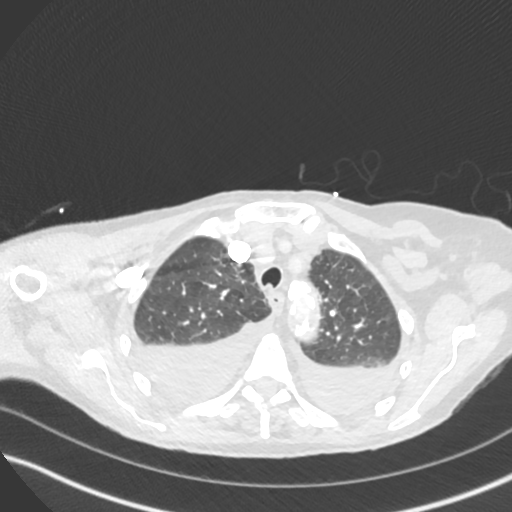
[im 343/395  soft-tissue]
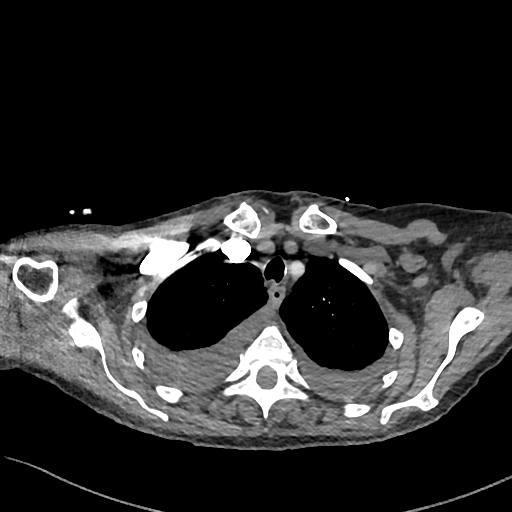
[im 377/395  lung]
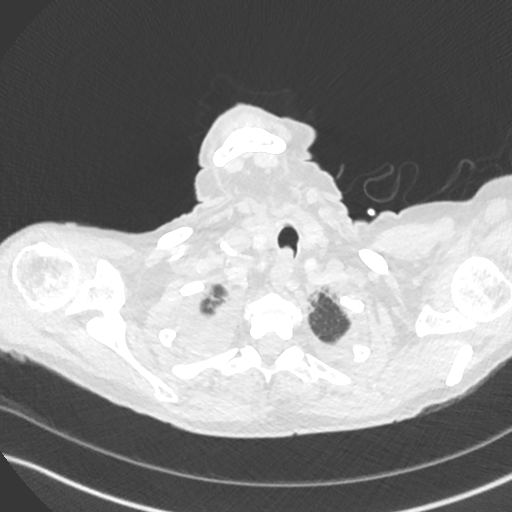

[Series 8: cor · coronal · 0.56mm/px · 3 of 118 slices shown]
[im 30/118  soft-tissue]
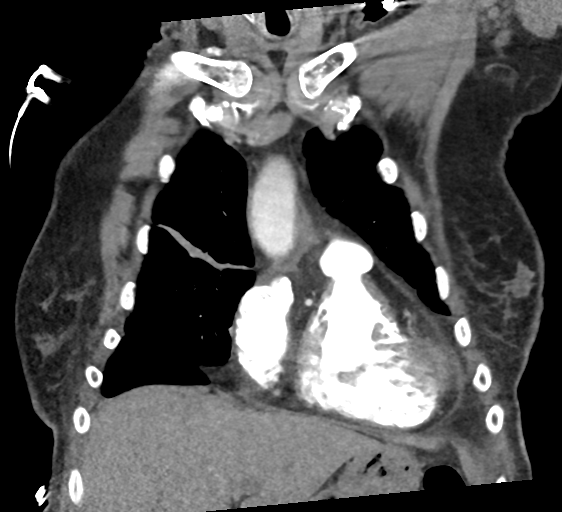
[im 59/118  soft-tissue]
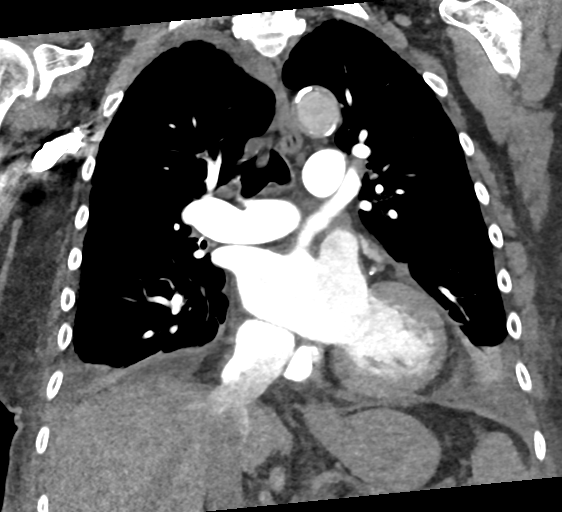
[im 88/118  soft-tissue]
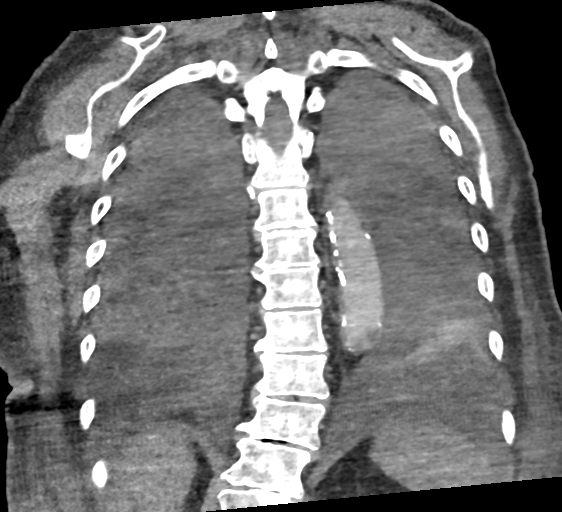

[18 of 46 positions shown; findings below may reference images not displayed]

RADIATION DOSE REDUCTION: This exam was performed according to the
departmental dose-optimization program which includes automated
exposure control, adjustment of the mA and/or kV according to
patient size and/or use of iterative reconstruction technique.

CONTRAST:  75mL OMNIPAQUE IOHEXOL 350 MG/ML SOLN
FINDINGS: Cardiovascular: Adequate opacification of the pulmonary arterial
tree. No intraluminal filling defect identified to suggest acute
pulmonary embolism. The central pulmonary arteries are enlarged in
keeping with changes of pulmonary arterial hypertension. Moderate
multi-vessel coronary artery calcification with stenting of the
proximal left circumflex coronary artery. Cardiac size is within
normal limits. No pericardial effusion. Mild atherosclerotic
calcification within the thoracic aorta. No aortic aneurysm.

Mediastinum/Nodes: No enlarged mediastinal, hilar, or axillary lymph
nodes. Thyroid gland, trachea, and esophagus demonstrate no
significant findings.

Lungs/Pleura: Large bilateral pleural effusions with compressive
atelectasis of the dependent lungs bilaterally. Discoid atelectasis
within the right middle lobe. No superimposed confluent pulmonary
infiltrate. Ground-glass opacity within the upper lobes likely
relate to atelectasis. No pneumothorax. No central obstructing
lesion.

Upper Abdomen: No acute abnormality. Mild body wall subcutaneous
edema noted, incompletely evaluated.

Musculoskeletal: Osseous structures are age-appropriate. No acute
bone abnormality. No lytic or blastic bone lesion.

Review of the MIP images confirms the above findings.
IMPRESSION: No pulmonary embolism.

Moderate coronary artery calcification.

Morphologic changes in keeping with pulmonary arterial hypertension.

Large bilateral pleural effusions with associated compressive
atelectasis of the dependent lungs.

Aortic Atherosclerosis ([id number]).
